# Patient Record
Sex: Female | Born: 1975 | Race: White | Hispanic: Yes | Marital: Married | Smoking: Never smoker
Health system: Southern US, Community
[De-identification: ages and names within clinical notes are randomized; demographics above are authoritative.]

## PROBLEM LIST (undated history)

## (undated) DIAGNOSIS — Z758 Other problems related to medical facilities and other health care: Secondary | ICD-10-CM

## (undated) DIAGNOSIS — O0930 Supervision of pregnancy with insufficient antenatal care, unspecified trimester: Secondary | ICD-10-CM

## (undated) DIAGNOSIS — E669 Obesity, unspecified: Secondary | ICD-10-CM

## (undated) DIAGNOSIS — Z789 Other specified health status: Secondary | ICD-10-CM

## (undated) DIAGNOSIS — Z603 Acculturation difficulty: Secondary | ICD-10-CM

## (undated) DIAGNOSIS — O24419 Gestational diabetes mellitus in pregnancy, unspecified control: Secondary | ICD-10-CM

## (undated) DIAGNOSIS — D179 Benign lipomatous neoplasm, unspecified: Secondary | ICD-10-CM

## (undated) HISTORY — PX: NO PAST SURGERIES: SHX2092

## (undated) HISTORY — DX: Gestational diabetes mellitus in pregnancy, unspecified control: O24.419

## (undated) HISTORY — DX: Obesity, unspecified: E66.9

## (undated) HISTORY — DX: Acculturation difficulty: Z60.3

## (undated) HISTORY — DX: Other specified health status: Z78.9

## (undated) HISTORY — DX: Supervision of pregnancy with insufficient antenatal care, unspecified trimester: O09.30

## (undated) HISTORY — DX: Other problems related to medical facilities and other health care: Z75.8

---

## 2005-03-19 ENCOUNTER — Ambulatory Visit: Payer: Self-pay | Admitting: Obstetrics and Gynecology

## 2005-03-23 ENCOUNTER — Inpatient Hospital Stay (HOSPITAL_COMMUNITY): Admission: AD | Admit: 2005-03-23 | Discharge: 2005-03-24 | Payer: Self-pay | Admitting: *Deleted

## 2005-03-24 ENCOUNTER — Ambulatory Visit: Payer: Self-pay | Admitting: *Deleted

## 2005-03-24 ENCOUNTER — Inpatient Hospital Stay (HOSPITAL_COMMUNITY): Admission: AD | Admit: 2005-03-24 | Discharge: 2005-03-26 | Payer: Self-pay | Admitting: *Deleted

## 2005-03-24 ENCOUNTER — Ambulatory Visit: Payer: Self-pay | Admitting: Family Medicine

## 2009-05-10 ENCOUNTER — Emergency Department (HOSPITAL_COMMUNITY): Admission: EM | Admit: 2009-05-10 | Discharge: 2009-05-10 | Payer: Self-pay | Admitting: Family Medicine

## 2009-05-21 ENCOUNTER — Emergency Department (HOSPITAL_COMMUNITY): Admission: EM | Admit: 2009-05-21 | Discharge: 2009-05-21 | Payer: Self-pay | Admitting: Family Medicine

## 2009-06-13 ENCOUNTER — Ambulatory Visit: Payer: Self-pay | Admitting: Internal Medicine

## 2009-07-04 ENCOUNTER — Ambulatory Visit: Payer: Self-pay | Admitting: Family Medicine

## 2009-07-04 LAB — CONVERTED CEMR LAB
ALT: 17 units/L (ref 0–35)
AST: 15 units/L (ref 0–37)
Albumin: 4.6 g/dL (ref 3.5–5.2)
Alkaline Phosphatase: 53 units/L (ref 39–117)
Basophils Absolute: 0 10*3/uL (ref 0.0–0.1)
Chloride: 105 meq/L (ref 96–112)
Hemoglobin: 12.3 g/dL (ref 12.0–15.0)
Lymphocytes Relative: 40 % (ref 12–46)
Lymphs Abs: 3.1 10*3/uL (ref 0.7–4.0)
Neutro Abs: 4 10*3/uL (ref 1.7–7.7)
Platelets: 267 10*3/uL (ref 150–400)
Potassium: 3.9 meq/L (ref 3.5–5.3)
RDW: 12.7 % (ref 11.5–15.5)
Sodium: 139 meq/L (ref 135–145)
Total Protein: 7.8 g/dL (ref 6.0–8.3)
Vit D, 25-Hydroxy: 12 ng/mL — ABNORMAL LOW (ref 30–89)
WBC: 7.9 10*3/uL (ref 4.0–10.5)

## 2009-07-11 ENCOUNTER — Ambulatory Visit: Payer: Self-pay | Admitting: Family Medicine

## 2009-07-23 ENCOUNTER — Encounter: Admission: RE | Admit: 2009-07-23 | Discharge: 2009-08-23 | Payer: Self-pay | Admitting: Family Medicine

## 2009-07-23 ENCOUNTER — Encounter (INDEPENDENT_AMBULATORY_CARE_PROVIDER_SITE_OTHER): Payer: Self-pay | Admitting: *Deleted

## 2010-01-21 ENCOUNTER — Encounter (INDEPENDENT_AMBULATORY_CARE_PROVIDER_SITE_OTHER): Payer: Self-pay | Admitting: Family Medicine

## 2010-01-21 LAB — CONVERTED CEMR LAB
LDL Cholesterol: 125 mg/dL — ABNORMAL HIGH (ref 0–99)
Total CHOL/HDL Ratio: 5.4
VLDL: 47 mg/dL — ABNORMAL HIGH (ref 0–40)

## 2010-05-21 NOTE — Letter (Signed)
Summary: *HSN Results Follow up  [facility]  [address]   [place], [place] [address]   Phone: [phone]  Fax: [phone]      07/23/2009   VIHANA KYDD DIAZ [address] [place], [place]  [address]   Dear  Ms. Gerrianne OJEDA DIAZ,                            ____S.Drinkard,FNP   __x__C. Inman,MD       ____B. McPherson,MD   ____V. Rankins,MD    ____E. Mulberry,MD    ____N. Daphine Deutscher, FNP  ____D. Reche Dixon, MD    ____K. Philipp Deputy, MD    ____Other     This letter is to inform you that your recent test(s):  ____x___Pap Smear    _______Lab Test     _______X-ray    ____x___ is within acceptable limits  _______ requires a medication change  _______ requires a follow-up lab visit  _______ requires a follow-up visit with your provider   Comments: repeat in one year       _________________________________________________________ If you have any questions, please contact our office                     Sincerely,  Gaylyn Cheers RN [address]

## 2010-07-08 LAB — POCT URINALYSIS DIP (DEVICE)
Bilirubin Urine: NEGATIVE
Glucose, UA: NEGATIVE mg/dL
Nitrite: NEGATIVE
Nitrite: NEGATIVE
Protein, ur: 30 mg/dL — AB
pH: 5 (ref 5.0–8.0)
pH: 5 (ref 5.0–8.0)

## 2010-07-08 LAB — CBC
HCT: 36.3 % (ref 36.0–46.0)
MCHC: 34.1 g/dL (ref 30.0–36.0)
MCV: 87.9 fL (ref 78.0–100.0)
Platelets: 161 10*3/uL (ref 150–400)
WBC: 7.9 10*3/uL (ref 4.0–10.5)

## 2010-07-08 LAB — POCT I-STAT, CHEM 8
Creatinine, Ser: 0.5 mg/dL (ref 0.4–1.2)
Glucose, Bld: 112 mg/dL — ABNORMAL HIGH (ref 70–99)
Hemoglobin: 12.9 g/dL (ref 12.0–15.0)
Sodium: 136 mEq/L (ref 135–145)
TCO2: 21 mmol/L (ref 0–100)

## 2010-07-08 LAB — DIFFERENTIAL
Eosinophils Absolute: 0 10*3/uL (ref 0.0–0.7)
Eosinophils Relative: 0 % (ref 0–5)
Lymphs Abs: 0.6 10*3/uL — ABNORMAL LOW (ref 0.7–4.0)
Monocytes Relative: 7 % (ref 3–12)

## 2010-07-08 LAB — POCT PREGNANCY, URINE: Preg Test, Ur: NEGATIVE

## 2010-07-08 LAB — URINE CULTURE

## 2011-01-30 ENCOUNTER — Other Ambulatory Visit: Payer: Self-pay | Admitting: Family Medicine

## 2011-01-30 DIAGNOSIS — Z3689 Encounter for other specified antenatal screening: Secondary | ICD-10-CM

## 2011-01-30 LAB — RUBELLA ANTIBODY, IGM: Rubella: IMMUNE

## 2011-02-05 ENCOUNTER — Ambulatory Visit (HOSPITAL_COMMUNITY)
Admission: RE | Admit: 2011-02-05 | Discharge: 2011-02-05 | Disposition: A | Discharge status: Home / Self Care | Payer: Medicaid Other | Source: Ambulatory Visit | Attending: Family Medicine | Admitting: Family Medicine

## 2011-02-05 DIAGNOSIS — Z363 Encounter for antenatal screening for malformations: Secondary | ICD-10-CM | POA: Insufficient documentation

## 2011-02-05 DIAGNOSIS — Z3689 Encounter for other specified antenatal screening: Secondary | ICD-10-CM

## 2011-02-05 DIAGNOSIS — Z1389 Encounter for screening for other disorder: Secondary | ICD-10-CM | POA: Insufficient documentation

## 2011-02-05 DIAGNOSIS — O358XX0 Maternal care for other (suspected) fetal abnormality and damage, not applicable or unspecified: Secondary | ICD-10-CM | POA: Insufficient documentation

## 2011-03-27 LAB — RPR: RPR: NONREACTIVE

## 2011-04-22 NOTE — L&D Delivery Note (Signed)
Delivery Note This is a G3 P2 at 41.4 weeks who was admitted for IOL for postdates.   She progressed well in labor and became completely dilated and immediately felt the urge to push. At 9:44 AM a viable and healthy female was delivered via Vaginal, Spontaneous Delivery (Presentation: ; Occiput Anterior).  APGAR: 9, 9; weight 7 lb 1.4 oz (3215 g).   No difficulty with shoulders. There was a tight nuchal cord, baby delivered through it.  Placenta status: Intact, Spontaneous.  Cord:  with the following complications: None.   There was a small 2nd degree midline laceration which was repaired in the usual fashion.   Anesthesia: None  Episiotomy: None Lacerations: 2nd degree;Perineal Suture Repair: 3.0 vicryl rapide Est. Blood Loss (mL): 200  Mom to postpartum.  Baby to nursery-stable.  [facility] 06/15/2011, 10:04 AM

## 2011-05-14 LAB — STREP B DNA PROBE: GBS: NEGATIVE

## 2011-06-04 ENCOUNTER — Other Ambulatory Visit (HOSPITAL_COMMUNITY): Payer: Self-pay | Admitting: Physician Assistant

## 2011-06-04 DIAGNOSIS — O480 Post-term pregnancy: Secondary | ICD-10-CM

## 2011-06-05 ENCOUNTER — Telehealth (HOSPITAL_COMMUNITY): Payer: Self-pay | Admitting: *Deleted

## 2011-06-05 ENCOUNTER — Encounter (HOSPITAL_COMMUNITY): Payer: Self-pay | Admitting: *Deleted

## 2011-06-05 ENCOUNTER — Inpatient Hospital Stay (HOSPITAL_COMMUNITY): Admission: AD | Admit: 2011-06-05 | Payer: Self-pay | Source: Ambulatory Visit | Admitting: Family Medicine

## 2011-06-05 NOTE — Telephone Encounter (Signed)
Preadmission screen  

## 2011-06-06 ENCOUNTER — Ambulatory Visit (HOSPITAL_COMMUNITY)
Admission: RE | Admit: 2011-06-06 | Discharge: 2011-06-06 | Disposition: A | Discharge status: Home / Self Care | Payer: Medicaid Other | Source: Ambulatory Visit | Attending: Physician Assistant | Admitting: Physician Assistant

## 2011-06-06 ENCOUNTER — Telehealth (HOSPITAL_COMMUNITY): Payer: Self-pay | Admitting: *Deleted

## 2011-06-06 DIAGNOSIS — O480 Post-term pregnancy: Secondary | ICD-10-CM

## 2011-06-06 DIAGNOSIS — Z3689 Encounter for other specified antenatal screening: Secondary | ICD-10-CM | POA: Insufficient documentation

## 2011-06-09 ENCOUNTER — Encounter (HOSPITAL_COMMUNITY): Payer: Self-pay | Admitting: *Deleted

## 2011-06-13 ENCOUNTER — Ambulatory Visit (INDEPENDENT_AMBULATORY_CARE_PROVIDER_SITE_OTHER): Payer: Self-pay | Admitting: *Deleted

## 2011-06-13 VITALS — BP 117/66 | Wt 185.5 lb

## 2011-06-13 DIAGNOSIS — O480 Post-term pregnancy: Secondary | ICD-10-CM

## 2011-06-13 NOTE — Progress Notes (Signed)
P = 70    Pt has IOL appt on 2/23 @ 1930

## 2011-06-13 NOTE — Progress Notes (Signed)
NST 06/13/11 reviewed and reactive

## 2011-06-14 ENCOUNTER — Inpatient Hospital Stay (HOSPITAL_COMMUNITY)
Admission: RE | Admit: 2011-06-14 | Discharge: 2011-06-17 | DRG: 775 | Disposition: A | Discharge status: Home / Self Care | Payer: Medicaid Other | Source: Ambulatory Visit | Attending: Obstetrics and Gynecology | Admitting: Obstetrics and Gynecology

## 2011-06-14 ENCOUNTER — Encounter (HOSPITAL_COMMUNITY): Payer: Self-pay

## 2011-06-14 DIAGNOSIS — O99893 Other specified diseases and conditions complicating puerperium: Secondary | ICD-10-CM | POA: Diagnosis not present

## 2011-06-14 DIAGNOSIS — H1010 Acute atopic conjunctivitis, unspecified eye: Secondary | ICD-10-CM | POA: Diagnosis not present

## 2011-06-14 DIAGNOSIS — O480 Post-term pregnancy: Principal | ICD-10-CM | POA: Diagnosis present

## 2011-06-14 HISTORY — DX: Other specified health status: Z78.9

## 2011-06-14 LAB — CBC
HCT: 36.5 % (ref 36.0–46.0)
MCV: 88.6 fL (ref 78.0–100.0)
RDW: 13.4 % (ref 11.5–15.5)
WBC: 9.1 10*3/uL (ref 4.0–10.5)

## 2011-06-14 MED ORDER — FLEET ENEMA 7-19 GM/118ML RE ENEM: 1.0000 | ENEMA | RECTAL | Status: DC | PRN

## 2011-06-14 MED ORDER — OXYTOCIN 20 UNITS IN LACTATED RINGERS INFUSION - SIMPLE
125.0000 mL/h | Freq: Once | INTRAVENOUS | Status: AC
  Administered 2011-06-15: 125 mL/h via INTRAVENOUS

## 2011-06-14 MED ORDER — CITRIC ACID-SODIUM CITRATE 334-500 MG/5ML PO SOLN: 30.0000 mL | ORAL | Status: DC | PRN

## 2011-06-14 MED ORDER — ZOLPIDEM TARTRATE 10 MG PO TABS: 10.0000 mg | ORAL_TABLET | Freq: Every evening | ORAL | Status: DC | PRN

## 2011-06-14 MED ORDER — ONDANSETRON HCL 4 MG/2ML IJ SOLN: 4.0000 mg | Freq: Four times a day (QID) | INTRAMUSCULAR | Status: DC | PRN

## 2011-06-14 MED ORDER — LIDOCAINE HCL (PF) 1 % IJ SOLN
30.0000 mL | INTRAMUSCULAR | Status: DC | PRN
  Administered 2011-06-15: 30 mL via SUBCUTANEOUS
  Filled 2011-06-14: qty 30

## 2011-06-14 MED ORDER — OXYCODONE-ACETAMINOPHEN 5-325 MG PO TABS
1.0000 | ORAL_TABLET | ORAL | Status: DC | PRN
  Administered 2011-06-15: 1 via ORAL
  Filled 2011-06-14: qty 1

## 2011-06-14 MED ORDER — BUTORPHANOL TARTRATE 2 MG/ML IJ SOLN
1.0000 mg | INTRAMUSCULAR | Status: DC | PRN
  Administered 2011-06-15: 1 mg via INTRAVENOUS
  Filled 2011-06-14: qty 1

## 2011-06-14 MED ORDER — ACETAMINOPHEN 325 MG PO TABS: 650.0000 mg | ORAL_TABLET | ORAL | Status: DC | PRN

## 2011-06-14 MED ORDER — OXYTOCIN BOLUS FROM INFUSION
500.0000 mL | Freq: Once | INTRAVENOUS | Status: DC
  Filled 2011-06-14: qty 500

## 2011-06-14 MED ORDER — LACTATED RINGERS IV SOLN
INTRAVENOUS | Status: DC
  Administered 2011-06-14: 20:00:00 via INTRAVENOUS

## 2011-06-14 MED ORDER — LACTATED RINGERS IV SOLN
500.0000 mL | INTRAVENOUS | Status: DC | PRN
  Administered 2011-06-15: 500 mL via INTRAVENOUS

## 2011-06-14 MED ORDER — IBUPROFEN 600 MG PO TABS: 600.0000 mg | ORAL_TABLET | Freq: Four times a day (QID) | ORAL | Status: DC | PRN

## 2011-06-14 MED ORDER — OXYTOCIN 20 UNITS IN LACTATED RINGERS INFUSION - SIMPLE
1.0000 m[IU]/min | INTRAVENOUS | Status: DC
  Administered 2011-06-14: 2 m[IU]/min via INTRAVENOUS
  Filled 2011-06-14: qty 1000

## 2011-06-14 NOTE — H&P (Signed)
Regina Castaneda is a 36 y.o. female presenting for induction of labor for postdates.History OB History    Grav Para Term Preterm Abortions TAB SAB Ect Mult Living   3 2 2  0      2     Past Medical History  Diagnosis Date  . Obese   . Late prenatal care   . Language Barrier     spanish  . No pertinent past medical history    Past Surgical History  Procedure Date  . No past surgeries    Family History: family history includes Asthma in her son and Peripheral vascular disease in her mother. Social History:  reports that she has never smoked. She has never used smokeless tobacco. She reports that she does not drink alcohol or use illicit drugs.  Review of Systems  Gastrointestinal: Positive for abdominal pain (intermittent contractions).  All other systems reviewed and are negative.    Dilation: 3 Effacement (%): 50 Station: -1 Exam by:: Roney Marion, CNM Blood pressure 153/88, pulse 69, temperature 97.9 F (36.6 C), temperature source Oral, resp. rate 20, height 5\' 3"  (1.6 m), weight 185 lb (83.915 kg), last menstrual period 08/24/2010. Maternal Exam:  Introitus: Vagina is positive for vaginal discharge (mucusy).    Physical Exam  Constitutional: She is oriented to person, place, and time. She appears well-developed and well-nourished. No distress.  HENT:  Head: Normocephalic.  Neck: Normal range of motion. Neck supple.  Cardiovascular: Normal rate, regular rhythm and normal heart sounds.   Respiratory: Effort normal and breath sounds normal. No respiratory distress.  GI: Soft. There is no tenderness.       EFW 7.5lbs  Genitourinary: No bleeding around the vagina. Vaginal discharge (mucusy) found.  Musculoskeletal: Normal range of motion.  Neurological: She is alert and oriented to person, place, and time.  Skin: Skin is warm and dry.   FHR 140's, +accel, reactive Prenatal labs: ABO, Rh: A/Positive/-- (10/11 0000) Antibody: Negative (10/11 0000) Rubella: Immune  (10/11 0000) RPR: Nonreactive (12/06 0000)  HBsAg: Negative (10/11 0000)  HIV: Non-reactive (10/11 0000)  GBS: Negative (01/23 0000)   Assessment/Plan: Induction of Labor Post Dates  Plan: Admit to [facility]thing Suites Pitocin Reassess prn   [facility] 06/14/2011, 8:34 PM

## 2011-06-15 ENCOUNTER — Encounter (HOSPITAL_COMMUNITY): Payer: Self-pay

## 2011-06-15 DIAGNOSIS — O480 Post-term pregnancy: Secondary | ICD-10-CM

## 2011-06-15 DIAGNOSIS — O99893 Other specified diseases and conditions complicating puerperium: Secondary | ICD-10-CM

## 2011-06-15 DIAGNOSIS — O9989 Other specified diseases and conditions complicating pregnancy, childbirth and the puerperium: Secondary | ICD-10-CM

## 2011-06-15 MED ORDER — BENZOCAINE-MENTHOL 20-0.5 % EX AERO: 1.0000 "application " | INHALATION_SPRAY | CUTANEOUS | Status: DC | PRN

## 2011-06-15 MED ORDER — LANOLIN HYDROUS EX OINT: TOPICAL_OINTMENT | CUTANEOUS | Status: DC | PRN

## 2011-06-15 MED ORDER — ONDANSETRON HCL 4 MG/2ML IJ SOLN: 4.0000 mg | INTRAMUSCULAR | Status: DC | PRN

## 2011-06-15 MED ORDER — WITCH HAZEL-GLYCERIN EX PADS: 1.0000 "application " | MEDICATED_PAD | CUTANEOUS | Status: DC | PRN

## 2011-06-15 MED ORDER — PRENATAL MULTIVITAMIN CH
1.0000 | ORAL_TABLET | Freq: Every day | ORAL | Status: DC
  Administered 2011-06-15: 1 via ORAL
  Filled 2011-06-15 (×2): qty 1

## 2011-06-15 MED ORDER — PRENATAL MULTIVITAMIN CH
1.0000 | ORAL_TABLET | Freq: Every day | ORAL | Status: DC
  Administered 2011-06-16 – 2011-06-17 (×2): 1 via ORAL

## 2011-06-15 MED ORDER — BENZOCAINE-MENTHOL 20-0.5 % EX AERO
INHALATION_SPRAY | CUTANEOUS | Status: AC
  Administered 2011-06-15: 13:00:00
  Filled 2011-06-15: qty 56

## 2011-06-15 MED ORDER — SENNOSIDES-DOCUSATE SODIUM 8.6-50 MG PO TABS
2.0000 | ORAL_TABLET | Freq: Every day | ORAL | Status: DC
  Administered 2011-06-15 – 2011-06-16 (×2): 2 via ORAL

## 2011-06-15 MED ORDER — ONDANSETRON HCL 4 MG PO TABS: 4.0000 mg | ORAL_TABLET | ORAL | Status: DC | PRN

## 2011-06-15 MED ORDER — DIPHENHYDRAMINE HCL 25 MG PO CAPS: 25.0000 mg | ORAL_CAPSULE | Freq: Four times a day (QID) | ORAL | Status: DC | PRN

## 2011-06-15 MED ORDER — IBUPROFEN 600 MG PO TABS
600.0000 mg | ORAL_TABLET | Freq: Four times a day (QID) | ORAL | Status: DC
  Administered 2011-06-15 – 2011-06-17 (×7): 600 mg via ORAL
  Filled 2011-06-15 (×7): qty 1

## 2011-06-15 MED ORDER — OXYCODONE-ACETAMINOPHEN 5-325 MG PO TABS
1.0000 | ORAL_TABLET | ORAL | Status: DC | PRN
  Administered 2011-06-15: 1 via ORAL
  Filled 2011-06-15: qty 1

## 2011-06-15 MED ORDER — TETANUS-DIPHTH-ACELL PERTUSSIS 5-2.5-18.5 LF-MCG/0.5 IM SUSP
0.5000 mL | Freq: Once | INTRAMUSCULAR | Status: AC
  Administered 2011-06-16: 0.5 mL via INTRAMUSCULAR
  Filled 2011-06-15: qty 0.5

## 2011-06-15 MED ORDER — DIBUCAINE 1 % RE OINT: 1.0000 "application " | TOPICAL_OINTMENT | RECTAL | Status: DC | PRN

## 2011-06-15 MED ORDER — SIMETHICONE 80 MG PO CHEW: 80.0000 mg | CHEWABLE_TABLET | ORAL | Status: DC | PRN

## 2011-06-15 MED ORDER — ZOLPIDEM TARTRATE 5 MG PO TABS: 5.0000 mg | ORAL_TABLET | Freq: Every evening | ORAL | Status: DC | PRN

## 2011-06-15 NOTE — Progress Notes (Signed)
Marta - interpreter @ bedside 

## 2011-06-15 NOTE — Progress Notes (Signed)
  Subjective: Pt with slight increase in pain with contractions.    Objective: BP 128/72  Pulse 73  Temp(Src) 97.9 F (36.6 C) (Oral)  Resp 20  Ht 5\' 3"  (1.6 m)  Wt 83.915 kg (185 lb)  BMI 32.77 kg/m2  LMP 08/24/2010      FHT:  FHR: 130's bpm, variability: moderate,  accelerations:  Present,  decelerations:  Present variables, intermittent UC:   irregular, every 3-6 minutes SVE:   Dilation: 3 Effacement (%): 50 Station: -1 Exam by:: Roney Marion, CNM  Labs: Lab Results  Component Value Date   WBC 9.1 06/14/2011   HGB 12.2 06/14/2011   HCT 36.5 06/14/2011   MCV 88.6 06/14/2011   PLT 191 06/14/2011    Assessment / Plan: Augmentation of labor, progressing well  Labor: Progressing on Pitocin, will continue to increase then AROM Preeclampsia:  n/a Fetal Wellbeing:  Category II Pain Control:  Labor support without medications I/D:  n/a Anticipated MOD:  NSVD  [facility] 06/15/2011, 1:09 AM

## 2011-06-15 NOTE — Progress Notes (Signed)
walidah cnm called dr constant to do another bedside ultrasound to confirm if pt is vertex

## 2011-06-15 NOTE — Progress Notes (Signed)
  Subjective: Pt reports leaking of fluid at 0530 and increase in contraction pain.  Objective: BP 121/69  Pulse 64  Temp(Src) 98.4 F (36.9 C) (Oral)  Resp 20  Ht 5\' 3"  (1.6 m)  Wt 83.915 kg (185 lb)  BMI 32.77 kg/m2  LMP 08/24/2010      FHT:  FHR: 130's bpm, variability: moderate,  accelerations:  Present,  decelerations:  Absent UC:   irregular, every 2-5 minutes SVE:   Dilation: 4.5 Effacement (%): 50 Station: -1 Exam by:: dr constant, per bedside ultrasound  Labs: Lab Results  Component Value Date   WBC 9.1 06/14/2011   HGB 12.2 06/14/2011   HCT 36.5 06/14/2011   MCV 88.6 06/14/2011   PLT 191 06/14/2011    Assessment / Plan: Induction of Labor for Postdates Spontaneous Rupture of Membranes  Labor: Slow progression Preeclampsia:  n/a Fetal Wellbeing:  Category I Pain Control:  Labor support without medications I/D:  n/a Anticipated MOD:  NSVD  [facility] 06/15/2011, 6:34 AM

## 2011-06-15 NOTE — Progress Notes (Signed)
Marta interpreter at bedside

## 2011-06-16 MED ORDER — ERYTHROMYCIN 5 MG/GM OP OINT
TOPICAL_OINTMENT | Freq: Four times a day (QID) | OPHTHALMIC | Status: DC
  Administered 2011-06-16 (×2): 1 via OPHTHALMIC
  Administered 2011-06-16 – 2011-06-17 (×3): via OPHTHALMIC
  Filled 2011-06-16: qty 3.5

## 2011-06-16 NOTE — Progress Notes (Signed)
Post Partum Day #1 Subjective: up ad lib, voiding, tolerating PO and + flatus Complaining of acute onset dry, red, itchy eyes bilaterally with discharge. Patient up with no difficulty. Breast and bottle feeding  Objective: Blood pressure 98/59, pulse 74, temperature 98.5 F (36.9 C), temperature source Oral, resp. rate 18, height 5\' 3"  (1.6 m), weight 83.915 kg (185 lb), last menstrual period 08/24/2010, unknown if currently breastfeeding.  Physical Exam:  General: alert, cooperative and no distress HEENT: Erythema and swelling of eyes bilaterally. Injected conjunctiva. Minimal discharge noted.  Lochia: appropriate Uterine Fundus: firm DVT Evaluation: No evidence of DVT seen on physical exam.   Basename 06/14/11 2005  HGB 12.2  HCT 36.5    Assessment/Plan: Plan for discharge tomorrow, Breastfeeding, Lactation consult and Contraception Condoms Conjunctivitis bilaterally: Allergic vs. Viral given bilateral appearance and acute onset. Will give Erythromycin ointment q6 hr.    LOS: 2 days   Regina Castaneda 06/16/2011, 7:37 AM

## 2011-06-16 NOTE — Progress Notes (Signed)
UR chart review completed.  

## 2011-06-16 NOTE — H&P (Signed)
Agree with above note.  Regina Castaneda 06/16/2011 11:56 AM

## 2011-06-16 NOTE — Plan of Care (Signed)
Problem: Discharge Progression Outcomes Goal: Barriers To Progression Addressed/Resolved Outcome: Progressing Limited [place]

## 2011-06-17 MED ORDER — OXYCODONE-ACETAMINOPHEN 5-325 MG PO TABS: 1.0000 | ORAL_TABLET | ORAL | Status: AC | PRN

## 2011-06-17 MED ORDER — ERYTHROMYCIN 5 MG/GM OP OINT: TOPICAL_OINTMENT | Freq: Four times a day (QID) | OPHTHALMIC | Status: AC

## 2011-06-17 MED ORDER — IBUPROFEN 600 MG PO TABS: 600.0000 mg | ORAL_TABLET | Freq: Four times a day (QID) | ORAL | Status: AC

## 2011-06-17 NOTE — Progress Notes (Signed)
Post Partum Day #2 Subjective: no complaints, up ad lib, voiding, tolerating PO and + flatus Patient has conjunctivitis, which has improved with drops. She is able to self-administer the drops with no difficulty. Hand washing has been discussed. No other concerns. Patient was seen with interpreter.  Objective: Blood pressure 122/76, pulse 75, temperature 98 F (36.7 C), temperature source Oral, resp. rate 18, height 5\' 3"  (1.6 m), weight 83.915 kg (185 lb), last menstrual period 08/24/2010, unknown if currently breastfeeding.  Physical Exam:  General: alert, cooperative and no distress Lochia: appropriate Uterine Fundus: firm DVT Evaluation: No evidence of DVT seen on physical exam.   Basename 06/14/11 2005  HGB 12.2  HCT 36.5    Assessment/Plan: Discharge home, Breastfeeding and Contraception Condoms Patient [facility] in 6 weeks.   LOS: 3 days   Latrese Carolan 06/17/2011, 7:11 AM

## 2011-06-17 NOTE — Discharge Summary (Signed)
Obstetric Discharge Summary Reason for Admission: induction of labor Prenatal Procedures: none Intrapartum Procedures: spontaneous vaginal delivery Postpartum Procedures: none Complications-Operative and Postpartum: none and patient with bilateral conjunctivitis (allergic vs viral.) Improved with Erythromycin drops. Hemoglobin  Date Value Range Status  06/14/2011 12.2  12.0-15.0 (g/dL) Final     HCT  Date Value Range Status  06/14/2011 36.5  36.0-46.0 (%) Final    Discharge Diagnoses: Term Pregnancy-delivered  Discharge Information: Date: 06/17/2011 Activity: pelvic rest Diet: routine Medications: Ibuprofen, Percocet and Erythromycin drops Condition: stable Instructions: refer to practice specific booklet Discharge to: home Follow-up Information    Follow up with [facility] [place] in 6 weeks.   Contact information:   [address] [employer] [place] [address]          Newborn Data: Live born female  Birth Weight: 7 lb 1.4 oz (3215 g) APGAR: 9, 9  Home with mother. Breast and bottle feeding. Plans to use condoms for contraception. Will follow up [facility] the [facility] in 6 weeks.  Regina Castaneda 06/17/2011, 7:48 AM

## 2011-06-17 NOTE — Progress Notes (Signed)
[facility] Department of Clinical Social Work Documentation of Interpretation   I assisted __Faculty Practice_________________ with interpretation of ___discharge___________________ for this patient.

## 2011-07-13 ENCOUNTER — Inpatient Hospital Stay (HOSPITAL_COMMUNITY)
Admission: AD | Admit: 2011-07-13 | Discharge: 2011-07-13 | Disposition: A | Discharge status: Home / Self Care | Payer: Medicaid Other | Source: Ambulatory Visit | Attending: Obstetrics & Gynecology | Admitting: Obstetrics & Gynecology

## 2011-07-13 DIAGNOSIS — N644 Mastodynia: Secondary | ICD-10-CM | POA: Insufficient documentation

## 2011-07-13 DIAGNOSIS — R509 Fever, unspecified: Secondary | ICD-10-CM | POA: Insufficient documentation

## 2011-07-13 DIAGNOSIS — N61 Mastitis without abscess: Secondary | ICD-10-CM

## 2011-07-13 DIAGNOSIS — O9122 Nonpurulent mastitis associated with the puerperium: Secondary | ICD-10-CM | POA: Insufficient documentation

## 2011-07-13 LAB — URINALYSIS, ROUTINE W REFLEX MICROSCOPIC
Bilirubin Urine: NEGATIVE
Ketones, ur: NEGATIVE mg/dL
Nitrite: NEGATIVE
Protein, ur: NEGATIVE mg/dL
Specific Gravity, Urine: 1.01 (ref 1.005–1.030)
Urobilinogen, UA: 1 mg/dL (ref 0.0–1.0)

## 2011-07-13 LAB — URINE MICROSCOPIC-ADD ON

## 2011-07-13 MED ORDER — CEPHALEXIN 500 MG PO CAPS: 500.0000 mg | ORAL_CAPSULE | Freq: Three times a day (TID) | ORAL | Status: AC

## 2011-07-13 NOTE — MAU Note (Signed)
Onset of fever, sore throat, cough chest discomfort and leg pain, had vaginal delivery one month ago currently breastfeeding.

## 2011-07-13 NOTE — Discharge Instructions (Signed)
Mastitis  (Mastitis) La mastitis es una infeccin por grmenes (bacteriana ) en el tejido mamario. CAUSES Las bacterias que causan la infeccin pueden entrar a travs de cortes o aberturas de la piel al tejido [place]. Generalmente esto ocurre al [employer], debido a las grietas o irritacin de la piel. Tambin se relaciona con la obstruccin de los conductos. Un piercing en los pezones puede ocasionar una mastitis. SNTOMAS En la mastitis, una zona de la mama se hincha, enrojece y duele. Puede asociarse con fiebre e hinchazn de los ganglios de la axila del mismo lado. Si se permite que la infeccin progrese, podr formarse un absceso (acumulacin de pus) DIAGNSTICO El profesional diagnosticar mastitis basndose en los sntomas y el examen fsico. El diagnstico podr confirmarse si se observa la presencia de pus en la mama. El pus se examinar en el laboratorio para determinar de qu bacteria se trata. Si hay un absceso, debern retirarle el lquido con [place]. Con el lquido se confirmar el diagnstico y se Production assistant, radio bacteria que causa el problema. En la [employer] no se observa pus. Le solicitarn pruebas de sangre para determinar si su organismo est luchando contra una infeccin bacteriana. En algunos [employer] indicarn una mamografa o una prueba de ultrasonido para descartar otras enfermedades, inclusive Management consultant. Otras formas raras de mastitis:  La mastitis tuberculosa es rara pero ha habido un incremento en los [place]. El germen de la tuberculosis puede afectar la mama si est presente en otras partes del organismo. La mama puede estar ligeramente sensible pero no doler.   La sfilis del pezn deja una lesin que no duele.   La actinomicosis es una infeccin bacteriana rara de la mama que se presenta como una masa que no duele.   La flebitis (inflamacin de los vasos sanguneos) de la mama es una inflamacin de las venas. Las [employer] ser un sostn  demasiado [place], un traumatismo o [place].   El carcinoma inflamatorio puede semejar una mastitis debido a que la mama est roja, hinchada y sensible, pero es una forma rara de cncer de mama.  TRATAMIENTO Para destruir las bacterias se usan antibiticos. El Nurse, children's qu bacteria es ms probable que cause la infeccin y Art gallery manager el tipo de antibitico ms adecuado. Podr cambiarlo segn el resultado del cultivo o si la respuesta al antibitico no es la [place]. Los antibiticos se administran por va oral. Si est amamantando es importante que vace la mama. El Ecologist informar si su leche es segura para el beb o debe descartarla. Podr Engineer, materials con medicamentos. INSTRUCCIONES PARA EL CUIDADO DOMICILIARIO  Tome todos los medicamentos tal como se le indic. No los suspenda slo porque, luego de [address], se siente mejor.   Utilice los medicamentos de venta libre o de prescripcin para Chief Technology Officer, Environmental health practitioner o la [place], segn se lo indique el profesional que lo asiste.   Si est amamantando, mantenga los pezones limpios y secos. Podrn aconsejarle que deje de [employer] profesional considere que es seguro para el beb. Use un sacaleche segn las indicaciones si se ve obligada a dejar de amamantar.   No use un sostn demasiado ajustado. Use un buen sostn de soporte   Vace la primera mama completamente antes de amamantar con la segunda. Si el beb no vaca la mama por algn motivo, utilice un sacaleche.   Si debe regresar a su empleo, use un sacaleche en el horario de trabajo para  mantener los horarios.   Aumente la ingestin de lquidos, especialmente si tiene fiebre.   Evite que las mamas se llenen mucho de [place] ATENCIN MDICA SI:  Presenta secreciones purulentas (similares al pus) que provienen de la mama.   Los sntomas (problemas) empeoran.   El tratamiento no hace efecto luego de [address].  SOLICITE ATENCIN MDICA  DE INMEDIATO SI:  Tiene fiebre.   El dolor y la hinchazn empeoran.   El dolor aumenta y no se alivia con los medicamentos.   Observa una lnea que se extiende desde la mama hasta la axila.  Document Released: 01/15/2005 Document Revised: 03/27/2011 [facility] Patient Information 2012 [employer], [place].

## 2011-07-13 NOTE — MAU Provider Note (Signed)
Attestation of Attending Supervision of Advanced Practitioner: Evaluation and management procedures were performed by the PA/NP/CNM/OB Fellow under my supervision/collaboration. Chart reviewed, and agree with management and plan.  Jaynie Collins, M.D. 07/13/2011 9:23 PM

## 2011-07-13 NOTE — MAU Provider Note (Signed)
History     CSN: [id number]  Arrival date and time: 07/13/11 1755  36 y.o.G3P3003 presents with breast pain and fever.    Chief Complaint  Patient presents with  . Fever  . Chest Pain  . Leg Pain  . Cough  . Sore Throat   HPI Pt presents with breast pain in her right breast, accompanied by fever/chills, some nausea but no vomiting, h/a, and some cough and sore throat x2 days.  She denies vaginal itching/burning, vaginal bleeding, or urinary symptoms.  Spanish translator used for all history taking, assessment, and teaching.  OB History    Grav Para Term Preterm Abortions TAB SAB Ect Mult Living   3 3 3  0      3      Past Medical History  Diagnosis Date  . Obese   . Late prenatal care   . Language Barrier     spanish  . No pertinent past medical history     Past Surgical History  Procedure Date  . No past surgeries     Family History  Problem Relation Age of Onset  . Peripheral vascular disease Mother   . Asthma Son     History  Substance Use Topics  . Smoking status: Never Smoker   . Smokeless tobacco: Never Used  . Alcohol Use: No    Allergies: No Known Allergies  Prescriptions prior to admission  Medication Sig Dispense Refill  . ibuprofen (ADVIL,MOTRIN) 600 MG tablet Take 600 mg by mouth every 6 (six) hours as needed. pain      . Prenatal Vit-Fe Fumarate-FA (PRENATAL MULTIVITAMIN) TABS Take 1 tablet by mouth daily.        Review of Systems  Constitutional: Positive for fever, chills and malaise/fatigue.  Eyes: Negative for blurred vision.  Respiratory: Positive for cough. Negative for shortness of breath.   Cardiovascular: Negative for chest pain.  Gastrointestinal: Negative for heartburn, nausea and vomiting.  Genitourinary: Negative for dysuria, urgency and frequency.  Musculoskeletal: Negative.   Neurological: Negative for dizziness and headaches.  Psychiatric/Behavioral: Negative for depression.   Physical Exam   Blood pressure 118/69,  pulse 90, temperature 99.2 F (37.3 C), resp. rate 20, height 5\' 1"  (1.549 m), weight 75.751 kg (167 lb), currently breastfeeding.  Physical Exam  Nursing note and vitals reviewed. Constitutional: She is oriented to person, place, and time. She appears well-developed and well-nourished.  Neck: Normal range of motion.  Cardiovascular: Normal rate, regular rhythm, normal heart sounds and intact distal pulses.   Respiratory: Effort normal and breath sounds normal. Right breast exhibits tenderness.    GI: Soft.  Musculoskeletal: Normal range of motion.  Neurological: She is alert and oriented to person, place, and time.  Skin: Skin is warm and dry.  Psychiatric: She has a normal mood and affect. Her behavior is normal. Judgment and thought content normal.   Results for orders placed during the hospital encounter of 07/13/11 (from the past 24 hour(s))  URINALYSIS, ROUTINE W REFLEX MICROSCOPIC     Status: Abnormal   Collection Time   07/13/11  6:15 PM      Component Value Range   Color, Urine YELLOW  YELLOW    APPearance CLEAR  CLEAR    Specific Gravity, Urine 1.010  1.005 - 1.030    pH 6.0  5.0 - 8.0    Glucose, UA NEGATIVE  NEGATIVE (mg/dL)   Hgb urine dipstick LARGE (*) NEGATIVE    Bilirubin Urine NEGATIVE  NEGATIVE  Ketones, ur NEGATIVE  NEGATIVE (mg/dL)   Protein, ur NEGATIVE  NEGATIVE (mg/dL)   Urobilinogen, UA 1.0  0.0 - 1.0 (mg/dL)   Nitrite NEGATIVE  NEGATIVE    Leukocytes, UA MODERATE (*) NEGATIVE   URINE MICROSCOPIC-ADD ON     Status: Abnormal   Collection Time   07/13/11  6:15 PM      Component Value Range   Squamous Epithelial / LPF FEW (*) RARE    WBC, UA 11-20  <3 (WBC/hpf)   RBC / HPF 21-50  <3 (RBC/hpf)   Bacteria, UA FEW (*) RARE   POCT PREGNANCY, URINE     Status: Normal   Collection Time   07/13/11  6:28 PM      Component Value Range   Preg Test, Ur NEGATIVE  NEGATIVE    MAU Course  Procedures U/A, breast exam   Assessment and Plan  A: Acute  mastitis of right breast  P: D/C home Keflex 500 mg TID x10 days Continue to breastfeed/pump from both breasts Warm moist heat for comfort, compresses/shower F/u with health department in 1 week Return to MAU as needed  LEFTWICH-KIRBY, Marshea Wisher 07/13/2011, 7:04 PM

## 2011-08-13 NOTE — Telephone Encounter (Signed)
Preadmission screen  

## 2012-11-20 ENCOUNTER — Encounter (HOSPITAL_COMMUNITY): Payer: Self-pay

## 2013-07-02 IMAGING — US US FETAL BPP W/O NONSTRESS
1 series · 13 of 13 positions shown · non-contrast
Comparison: none

[Series 1: us fetal bpp w/o nonstress · non-contrast · 13 acquisitions, 13 frames shown]
[im 1/13]
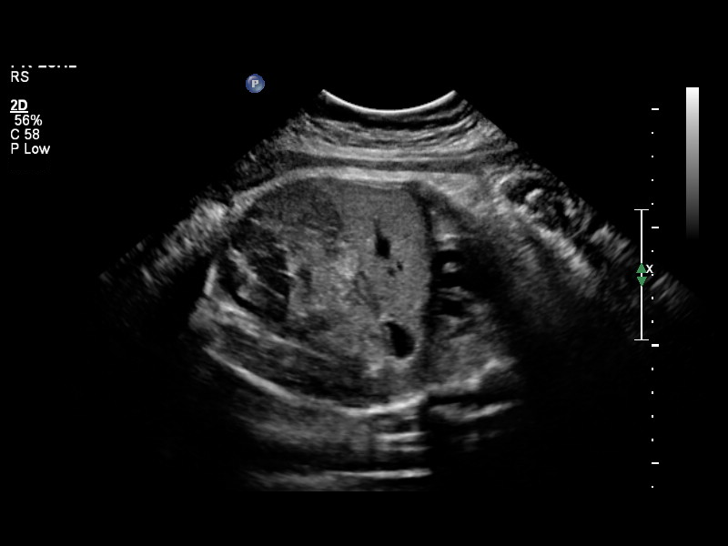
[im 2/13]
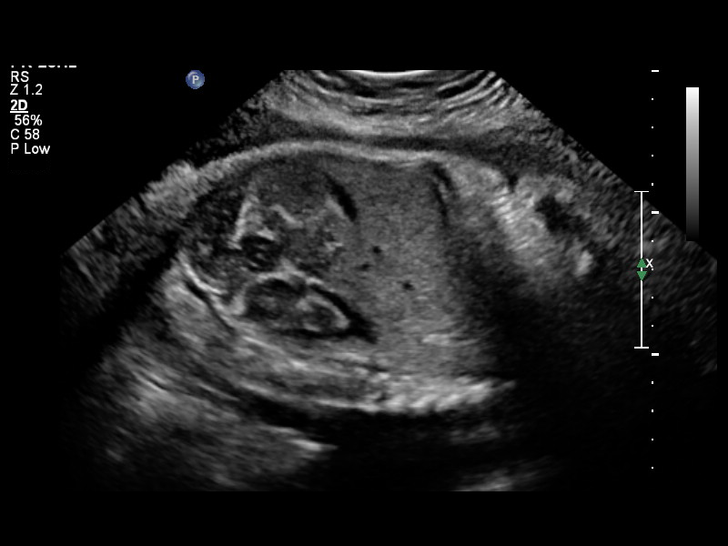
[im 3/13]
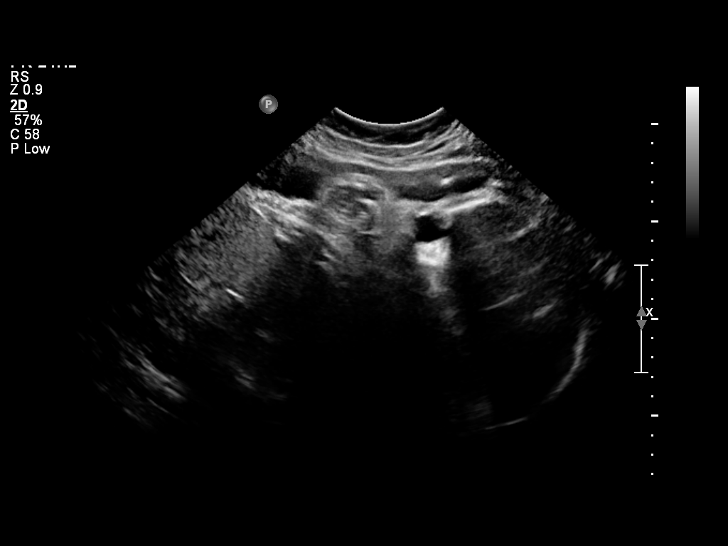
[im 4/13]
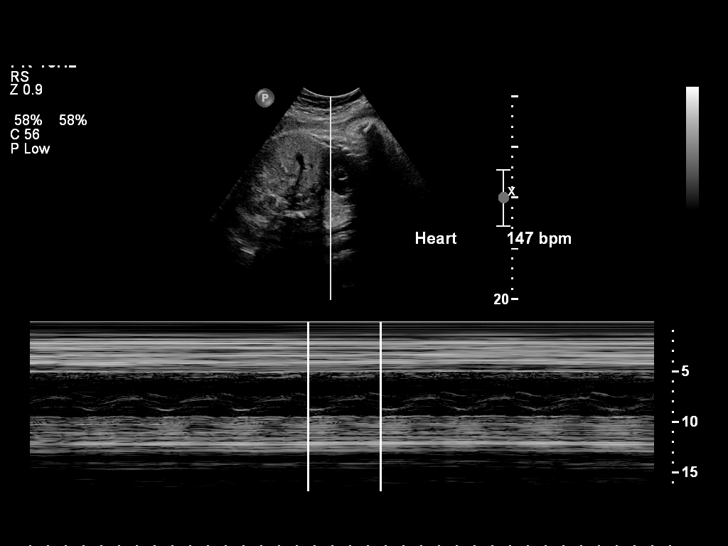
[im 5/13]
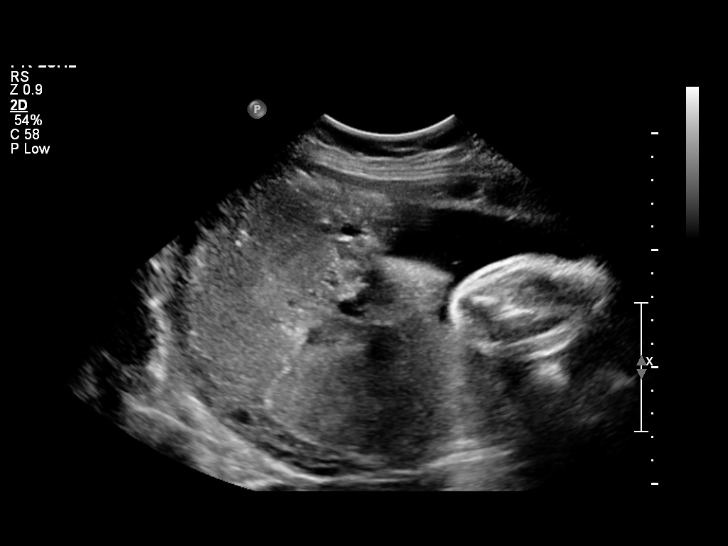
[im 6/13]
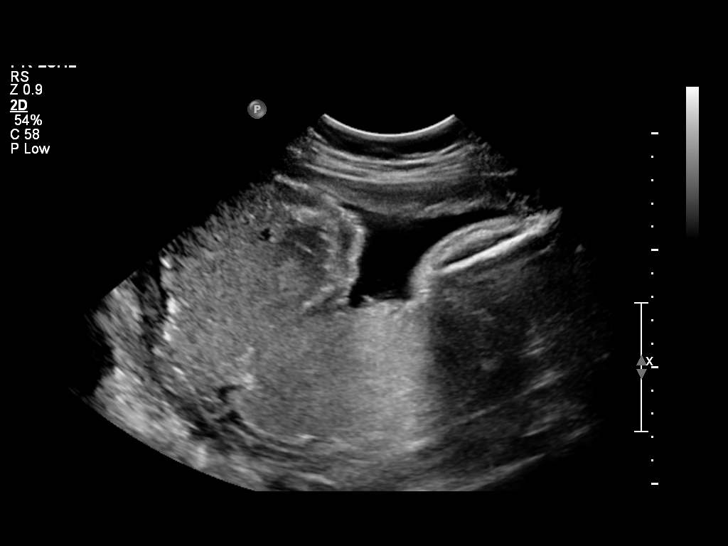
[im 7/13]
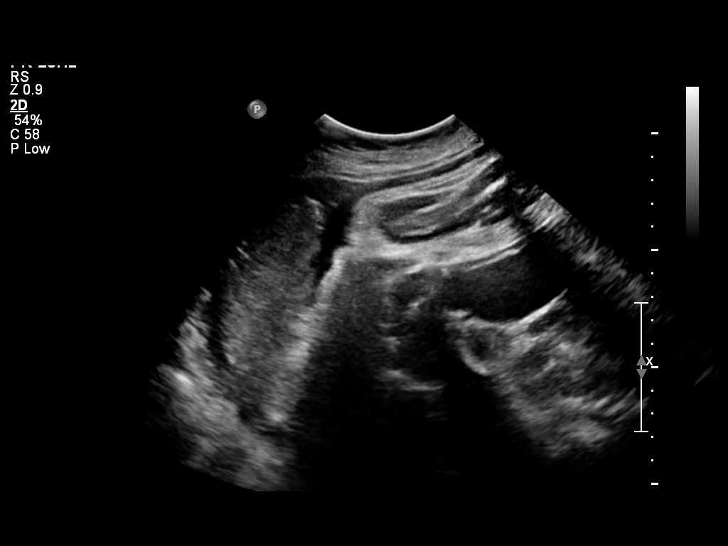
[im 8/13]
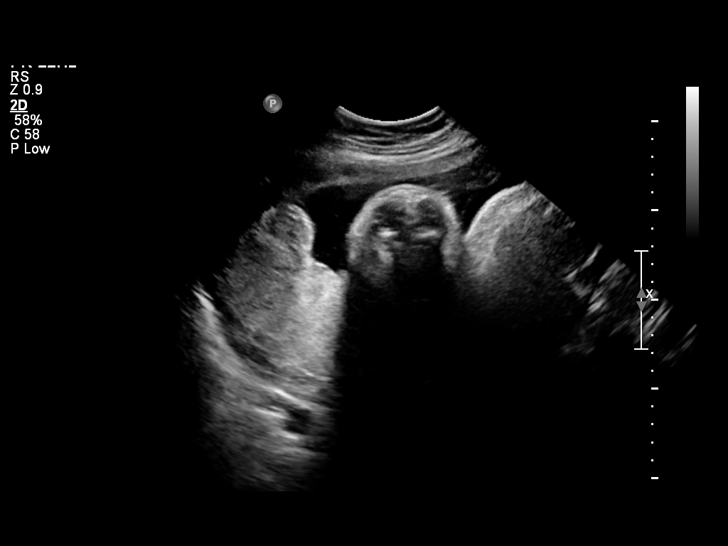
[im 9/13]
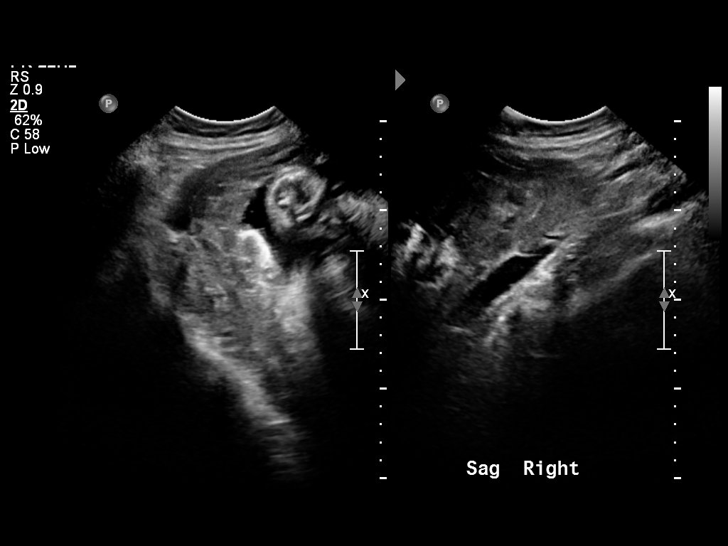
[im 10/13]
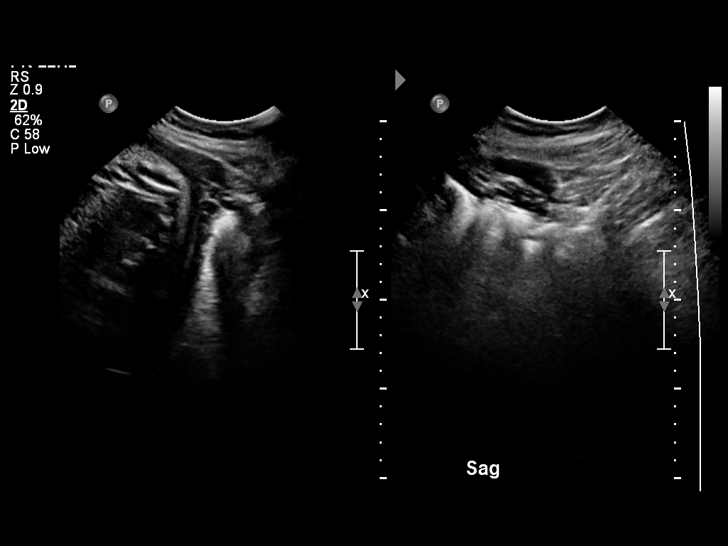
[im 11/13]
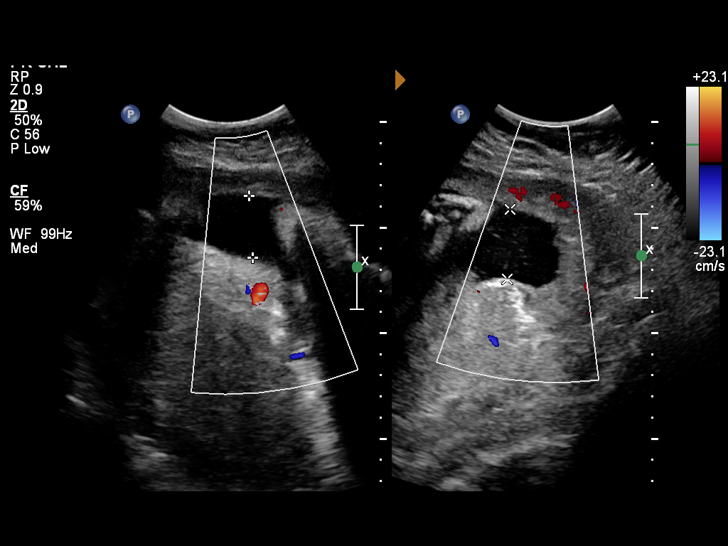
[im 12/13]
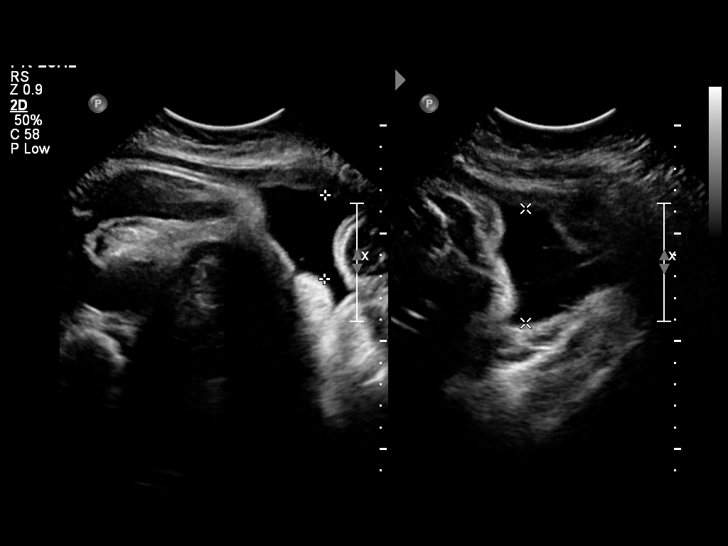
[im 13/13]
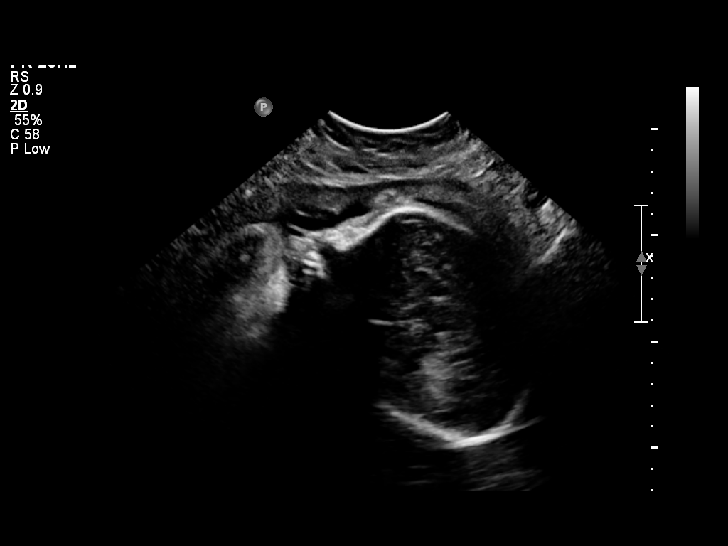

[13 of 13 positions shown; findings below may reference images not displayed]

OBSTETRICS REPORT
                      (Signed Final 06/06/2011 [DATE])

 Order#:         [id number]
                 57_O
Procedures

 [HOSPITAL]                                         76815.0
Indications

 Postdate pregnancy (40-42 weeks)
 Assess amniotic fluid volume
 Assess fetal well being
Fetal Evaluation

 Fetal Heart Rate:  147                         bpm
 Cardiac Activity:  Observed
 Presentation:      Cephalic
 Placenta:          Posterior, above cervical
                    os

 Amniotic Fluid
 AFI FV:      Subjectively within normal limits
 AFI Sum:     15.39   cm      67   %Tile     Larg Pckt:   5.29   cm
 RUQ:   2.93   cm    RLQ:    3.88   cm    LUQ:   5.29    cm   LLQ:    3.29   cm
Biophysical Evaluation

 Amniotic F.V:   Pocket => 2 cm two         F. Tone:        Observed
                 planes
 F. Movement:    Observed                   Score:          [DATE]
 F. Breathing:   Observed
Gestational Age

 LMP:           39w 3d       Date:   09/03/10                 EDD:   06/10/11
 Best:          40w 2d    Det. By:   U/S    (02/05/11)        EDD:   06/04/11
Cervix Uterus Adnexa

 Cervix:       Not visualized (advanced GA >34 wks)

 Adnexa:     No abnormality visualized.
Impression

 Single living intrauterine pregnancy in cephalic presentation.
 The estimated gestational age is 40w 2d based on U/S
 (02/05/11). The biophysical profile is [DATE]. Amniotic fluid index
 is 15.39 cm.

 questions or concerns.

## 2013-12-07 ENCOUNTER — Telehealth: Payer: Self-pay | Admitting: Pediatrics

## 2013-12-07 DIAGNOSIS — Z207 Contact with and (suspected) exposure to pediculosis, acariasis and other infestations: Secondary | ICD-10-CM

## 2013-12-07 DIAGNOSIS — Z2089 Contact with and (suspected) exposure to other communicable diseases: Secondary | ICD-10-CM

## 2013-12-07 MED ORDER — PERMETHRIN 5 % EX CREA: 1.0000 "application " | TOPICAL_CREAM | Freq: Once | CUTANEOUS | Status: DC

## 2013-12-07 NOTE — Telephone Encounter (Signed)
Regina Castaneda's son Regina Castaneda(Regina Castaneda) [facility] and diagnosed with scabies.  All household contacts are being treated with permethrin for exposure to scabies.

## 2014-02-20 ENCOUNTER — Encounter (HOSPITAL_COMMUNITY): Payer: Self-pay

## 2016-11-10 ENCOUNTER — Other Ambulatory Visit (HOSPITAL_COMMUNITY): Payer: Self-pay | Admitting: *Deleted

## 2016-11-10 DIAGNOSIS — N6310 Unspecified lump in the right breast, unspecified quadrant: Secondary | ICD-10-CM

## 2016-11-20 ENCOUNTER — Ambulatory Visit (HOSPITAL_COMMUNITY): Payer: Medicaid Other

## 2016-11-20 ENCOUNTER — Encounter (HOSPITAL_COMMUNITY): Payer: Self-pay

## 2016-11-20 ENCOUNTER — Ambulatory Visit
Admission: RE | Admit: 2016-11-20 | Discharge: 2016-11-20 | Disposition: A | Discharge status: Home / Self Care | Payer: Self-pay | Source: Ambulatory Visit | Attending: Obstetrics and Gynecology | Admitting: Obstetrics and Gynecology

## 2016-11-20 ENCOUNTER — Ambulatory Visit (HOSPITAL_COMMUNITY)
Admission: RE | Admit: 2016-11-20 | Discharge: 2016-11-20 | Disposition: A | Discharge status: Home / Self Care | Payer: Self-pay | Source: Ambulatory Visit | Attending: Obstetrics and Gynecology | Admitting: Obstetrics and Gynecology

## 2016-11-20 ENCOUNTER — Ambulatory Visit
Admission: RE | Admit: 2016-11-20 | Discharge: 2016-11-20 | Disposition: A | Discharge status: Home / Self Care | Payer: No Typology Code available for payment source | Source: Ambulatory Visit | Attending: Obstetrics and Gynecology | Admitting: Obstetrics and Gynecology

## 2016-11-20 VITALS — BP 116/68 | HR 65 | Ht 62.0 in | Wt 191.5 lb

## 2016-11-20 DIAGNOSIS — Z1239 Encounter for other screening for malignant neoplasm of breast: Secondary | ICD-10-CM

## 2016-11-20 DIAGNOSIS — N6452 Nipple discharge: Secondary | ICD-10-CM

## 2016-11-20 DIAGNOSIS — N6310 Unspecified lump in the right breast, unspecified quadrant: Secondary | ICD-10-CM

## 2016-11-20 NOTE — Progress Notes (Signed)
Complaints of a pimple appearing lump on her right nipple that a white discharge comes out of when expressed.   Pap Smear: Pap smear not completed today. [facility] and normal. Per patient has no history of an abnormal Pap smear.  Physical exam: Breasts Breasts symmetrical. No skin abnormalities left breast. A pimple observed on right nipple. No nipple retraction bilateral breasts. No nipple discharge left breast. A thick white discharge observed when expressed right breast. Unable to get enough discharge to send a sample to Cytology. No lymphadenopathy. No lumps palpated left breast. Palpated a lump versus thickened skin within the right breast at 3 o'clock next to areola. Complaints of tenderness when palpated right breast at 3 o'clock next to areola. [facility] and possible right breast ultrasound. Appointment scheduled for Thursday, November 20, 2016 at 0920.        Pelvic/Bimanual No Pap smear completed today since last Pap smear was 09/19/2016. Pap smear not indicated per BCCCP guidelines.   Smoking History: Patient has never smoked.  Patient Navigation: Patient education provided. Access to services [facility]. Spanish interpreter provided.    Used Spanish interpreter Radiation protection practitionerAngie Segarra from [facility].

## 2016-11-20 NOTE — Patient Instructions (Addendum)
Explained breast self awareness with [name]. Patient did not need a Pap smear today due to last Pap smear was 09/19/2016. Let her know BCCCP will cover Pap smears every 3 years unless has a history of abnormal Pap smears. [facility] diagnostic mamm[facility] and possible right breast ultrasound. Appointment scheduled for Thursday, November 20, 2016 at 0920. [name] verbalized understanding.  Taylin Leder, Kathaleen Maserhristine Poll, RN 9:08 AM

## 2017-02-10 ENCOUNTER — Encounter (HOSPITAL_COMMUNITY): Payer: Self-pay | Admitting: Emergency Medicine

## 2017-02-10 ENCOUNTER — Ambulatory Visit (HOSPITAL_COMMUNITY)
Admission: EM | Admit: 2017-02-10 | Discharge: 2017-02-10 | Disposition: A | Discharge status: Home / Self Care | Payer: Self-pay | Attending: Emergency Medicine | Admitting: Emergency Medicine

## 2017-02-10 DIAGNOSIS — N760 Acute vaginitis: Secondary | ICD-10-CM | POA: Insufficient documentation

## 2017-02-10 DIAGNOSIS — R300 Dysuria: Secondary | ICD-10-CM | POA: Insufficient documentation

## 2017-02-10 DIAGNOSIS — B9689 Other specified bacterial agents as the cause of diseases classified elsewhere: Secondary | ICD-10-CM

## 2017-02-10 DIAGNOSIS — Z79899 Other long term (current) drug therapy: Secondary | ICD-10-CM | POA: Insufficient documentation

## 2017-02-10 LAB — POCT URINALYSIS DIP (DEVICE)
BILIRUBIN URINE: NEGATIVE
Glucose, UA: NEGATIVE mg/dL
HGB URINE DIPSTICK: NEGATIVE
Ketones, ur: NEGATIVE mg/dL
Leukocytes, UA: NEGATIVE
NITRITE: NEGATIVE
PH: 7 (ref 5.0–8.0)
PROTEIN: NEGATIVE mg/dL
Specific Gravity, Urine: 1.01 (ref 1.005–1.030)
Urobilinogen, UA: 0.2 mg/dL (ref 0.0–1.0)

## 2017-02-10 LAB — POCT PREGNANCY, URINE: Preg Test, Ur: NEGATIVE

## 2017-02-10 MED ORDER — METRONIDAZOLE 500 MG PO TABS: 500.0000 mg | ORAL_TABLET | Freq: Two times a day (BID) | ORAL | 0 refills | Status: AC

## 2017-02-10 MED ORDER — PHENAZOPYRIDINE HCL 200 MG PO TABS: 200.0000 mg | ORAL_TABLET | Freq: Three times a day (TID) | ORAL | 0 refills | Status: DC | PRN

## 2017-02-10 NOTE — ED Triage Notes (Signed)
With spanish interpreter. Pt states "i think I have a urinary infection, when I try to urinate, I feel an intense burning sensation"

## 2017-02-10 NOTE — ED Provider Notes (Signed)
HPI  SUBJECTIVE:  Regina Castaneda is a 41 y.o. female who presents with dysuria for the past 3 days. She does report vaginal itching. She no aggravating or alleviating factors. She has not tried anything for this. No urinary urgency, frequency, cloudy or odorous urine, hematuria. No fevers, abdominal, back, pelvic pain. No vaginal odor, discharge, bleeding. No genital rash. No antibiotics in the past month. She is in a long-term monogamous relationship with a female who is asymptomatic. STDs are not a concern today. No antipyretic in the past 6-8 hours. No perfumed body washes or soaps. Past medical history of UTI. No history of pyelonephritis, nephrolithiasis. No history of PID, gonorrhea, chlamydia, HIV, HSV, syphilis, Trichomonas, BV, yeast, diabetes, hypertension. LMP: 10/1. PMD: None. All history obtained through language line.   Past Medical History:  Diagnosis Date  . Language barrier    spanish  . Late prenatal care   . No pertinent past medical history   . Obese     Past Surgical History:  Procedure Laterality Date  . NO PAST SURGERIES      Family History  Problem Relation Age of Onset  . Peripheral vascular disease Mother   . Asthma Son     Social History  Substance Use Topics  . Smoking status: Never Smoker  . Smokeless tobacco: Never Used  . Alcohol use No    No current facility-administered medications for this encounter.   Current Outpatient Prescriptions:  .  ibuprofen (ADVIL,MOTRIN) 600 MG tablet, Take 600 mg by mouth every 6 (six) hours as needed. pain, Disp: , Rfl:  .  metroNIDAZOLE (FLAGYL) 500 MG tablet, Take 1 tablet (500 mg total) by mouth 2 (two) times daily., Disp: 14 tablet, Rfl: 0 .  phenazopyridine (PYRIDIUM) 200 MG tablet, Take 1 tablet (200 mg total) by mouth 3 (three) times daily as needed for pain., Disp: 6 tablet, Rfl: 0 .  Prenatal Vit-Fe Fumarate-FA (PRENATAL MULTIVITAMIN) TABS, Take 1 tablet by mouth daily., Disp: , Rfl:   No Known  Allergies   ROS  As noted in HPI.   Physical Exam  BP 127/73   Pulse 74   Temp 98.1 F (36.7 C) (Oral)   Resp 16   LMP 01/19/2017   SpO2 100%   Constitutional: Well developed, well nourished, no acute distress Eyes:  EOMI, conjunctiva normal bilaterally HENT: Normocephalic, atraumatic,mucus membranes moist Respiratory: Normal inspiratory effort Cardiovascular: Normal rate GI: nondistended,  no suprapubic, flank tenderness Back: No CVA tenderness GU: Normal external genitalia. Positive odorous vaginal discharge. Friable os. No CMT. No adnexal tenderness. No adnexal masses. Uterus smooth, nontender. Chaperone present during exam. skin: No rash, skin intact Musculoskeletal: no deformities Neurologic: Alert & oriented x 3, no focal neuro deficits Psychiatric: Speech and behavior appropriate   ED Course   Medications - No data to display  Orders Placed This Encounter  Procedures  . Urine culture    Standing Status:   Standing    Number of Occurrences:   1  . POCT urinalysis dip (device)    Standing Status:   Standing    Number of Occurrences:   1  . Pregnancy, urine POC    Standing Status:   Standing    Number of Occurrences:   1    Results for orders placed or performed during the hospital encounter of 02/10/17 (from the past 24 hour(s))  POCT urinalysis dip (device)     Status: None   Collection Time: 02/10/17  4:07 PM  Result  Value Ref Range   Glucose, UA NEGATIVE NEGATIVE mg/dL   Bilirubin Urine NEGATIVE NEGATIVE   Ketones, ur NEGATIVE NEGATIVE mg/dL   Specific Gravity, Urine 1.010 1.005 - 1.030   Hgb urine dipstick NEGATIVE NEGATIVE   pH 7.0 5.0 - 8.0   Protein, ur NEGATIVE NEGATIVE mg/dL   Urobilinogen, UA 0.2 0.0 - 1.0 mg/dL   Nitrite NEGATIVE NEGATIVE   Leukocytes, UA NEGATIVE NEGATIVE  Pregnancy, urine POC     Status: None   Collection Time: 02/10/17  4:09 PM  Result Value Ref Range   Preg Test, Ur NEGATIVE NEGATIVE   No results found.  ED  Clinical Impression  Dysuria  Bacterial vaginosis   ED Assessment/Plan   Patient not pregnant. Urine dip negative for UTI.  Using the language line, discussed that patient does not have glucose in her urine nor does she have a urinary tract infection however we will send her urine off for culture to confirm this. Feel that her symptoms are most likely from  BV, so we'll send home with Flagyl. Also home with Pyridium. Patient is in a 5715 year monogamous relationship with her husband, so do not think that we need to treat empirically for gonorrhea and chlamydia today although these tests were sent. We'll provide primary care referral list for routine care.  Discussed labs,  MDM, plan and followup with patient  Discussed sn/sx that should prompt return to the ED. Patient agrees with plan.   Spent over 25 minutes with the patient obtaining H&P, performed physical exam, explaining labs and medical decision making, plan for follow-up.  Meds ordered this encounter  Medications  . metroNIDAZOLE (FLAGYL) 500 MG tablet    Sig: Take 1 tablet (500 mg total) by mouth 2 (two) times daily.    Dispense:  14 tablet    Refill:  0  . phenazopyridine (PYRIDIUM) 200 MG tablet    Sig: Take 1 tablet (200 mg total) by mouth 3 (three) times daily as needed for pain.    Dispense:  6 tablet    Refill:  0    *This clinic note was created using Scientist, clinical (histocompatibility and immunogenetics)Dragon dictation software. Therefore, there may be occasional mistakes despite careful proofreading.  ?   Domenick GongMortenson, Kameryn Davern, MD 02/11/17 [phone]

## 2017-02-10 NOTE — Discharge Instructions (Signed)
Take the medication as written. Give us a working phone number so that we can contact you if needed. Refrain from sexual contact until you know your results and your partner(s) are treated if necessary. Return to the ER if you get worse, have a fever >100.4, or for any concerns.  ° ° °Below is a list of primary care practices who are taking new patients for you to follow-up with. °[facility] °[address] °[place], [place] [address] °[phone] ° °[facility] °[phone] °[address] °[place] [address] ° °[facility]: [address] °[place] [address]  °[phone] ° °[facility]: [address] °[place] [address]   °[phone] ° °[facility]: [address] °[place] [address]  °[phone] ° °[facility] : [address] [place] [address] °[phone] ° °[facility]: [address] °[place] [address] °[phone] ° °[facility]: [address] °[place] [address] °[phone] ° °Dr. Mahima Pandey [address] [facility] [place] [address]  °[phone] ° °Dr. George Osei-Bonsu, [facility]. [address] , [place] [address]  °[phone] ° °Go to www.goodrx.com to look up your medications. This will give you a list of where you can find your prescriptions at the most affordable prices. Or ask the pharmacist what the cash price is, or if they have any other discount programs available to help make your medication more affordable. This can be less expensive than what you would pay with insurance.   °

## 2017-02-11 LAB — CERVICOVAGINAL ANCILLARY ONLY
Bacterial vaginitis: NEGATIVE
CANDIDA VAGINITIS: NEGATIVE
CHLAMYDIA, DNA PROBE: NEGATIVE
NEISSERIA GONORRHEA: NEGATIVE
TRICH (WINDOWPATH): NEGATIVE

## 2017-02-13 ENCOUNTER — Telehealth (HOSPITAL_COMMUNITY): Payer: Self-pay | Admitting: Internal Medicine

## 2017-02-13 LAB — URINE CULTURE

## 2017-02-13 NOTE — Telephone Encounter (Signed)
Clinical staff, please let patient know that urine culture was positive for Staph germ, sensitive to nitrofurantoin.  Please send rx for nitrofurantoin 100mg  bid x 5d #10 no refills.  Recheck for further evaluation if symptoms are not improving.  LM

## 2017-02-14 ENCOUNTER — Telehealth: Payer: Self-pay

## 2017-02-14 NOTE — Telephone Encounter (Signed)
Reached Daughter who speaks [place]. She states that Dr Rennie PlowmanMurray's office had not [facility] [place] [phone] Nitrofurantoin 100mg  BID x 5 days per Dietrich PatesHina Khatri PA

## 2017-02-14 NOTE — Telephone Encounter (Signed)
Instructed to call pt to verify if prescription for Macrobid was call in for pt. For [facility] ED 02/11/17  Letter to home. Not able to contact pt.. Per Dietrich PatesHina Khatri PA

## 2017-06-21 ENCOUNTER — Other Ambulatory Visit: Payer: Self-pay

## 2017-06-21 ENCOUNTER — Encounter (HOSPITAL_COMMUNITY): Payer: Self-pay | Admitting: *Deleted

## 2017-06-21 ENCOUNTER — Ambulatory Visit (HOSPITAL_COMMUNITY)
Admission: EM | Admit: 2017-06-21 | Discharge: 2017-06-21 | Disposition: A | Discharge status: Home / Self Care | Payer: Self-pay | Attending: Urgent Care | Admitting: Urgent Care

## 2017-06-21 ENCOUNTER — Telehealth (HOSPITAL_COMMUNITY): Payer: Self-pay | Admitting: *Deleted

## 2017-06-21 DIAGNOSIS — R059 Cough, unspecified: Secondary | ICD-10-CM

## 2017-06-21 DIAGNOSIS — R52 Pain, unspecified: Secondary | ICD-10-CM

## 2017-06-21 DIAGNOSIS — R05 Cough: Secondary | ICD-10-CM

## 2017-06-21 DIAGNOSIS — R69 Illness, unspecified: Secondary | ICD-10-CM

## 2017-06-21 DIAGNOSIS — J111 Influenza due to unidentified influenza virus with other respiratory manifestations: Secondary | ICD-10-CM

## 2017-06-21 MED ORDER — BENZONATATE 100 MG PO CAPS: 100.0000 mg | ORAL_CAPSULE | Freq: Three times a day (TID) | ORAL | 0 refills | Status: DC | PRN

## 2017-06-21 MED ORDER — PSEUDOEPHEDRINE HCL ER 120 MG PO TB12: 120.0000 mg | ORAL_TABLET | Freq: Two times a day (BID) | ORAL | 3 refills | Status: DC

## 2017-06-21 NOTE — Discharge Instructions (Signed)
Tome 500mg de Tylenol con ibuprofen 400-600mg cada 6 horas con comida para dolor y inflammacion. Para el dolor de garganta intente usar un té de miel. Use 3 cucharaditas de miel con jugo exprimido de medio limón. Coloque las piezas de jengibre afeitadas en 1/2 - 1 taza de agua y caliente sobre la estufa. Luego mezcle los ingredientes y repita cada 4 horas. ° °

## 2017-06-21 NOTE — ED Provider Notes (Signed)
  MRN: [id number] DOB: Mar 09, 1976  Subjective:   [name] is a 42 y.o. female presenting for 3 day history of body aches, mild headaches, mild sore throat, productive cough that elicits chest and abdominal pain and back pain. Has also had nausea without vomiting. Has tried Flanax with minimal relief. Denies smoking cigarettes. Denies fever, rashes.   Regina HesselbachMaria has No Known Allergies.  Regina HesselbachMaria  has a past medical history of Language barrier, Late prenatal care, No pertinent past medical history, and Obese. Denies past surgical history.   Objective:   Vitals: BP (!) 117/59   Pulse 88   Temp 98.5 F (36.9 C) (Oral)   Resp 18   LMP 06/13/2017 (Approximate)   SpO2 98%   Breastfeeding? No   Physical Exam  Constitutional: She is oriented to person, place, and time. She appears well-developed and well-nourished.  HENT:  TM's intact bilaterally, no effusions or erythema. Nasal turbinates pink, dry, nasal passages patent. No sinus tenderness. Oropharynx with post-nasal drainage, mucous membranes moist.   Eyes: Right eye exhibits no discharge. Left eye exhibits no discharge.  Neck: Normal range of motion. Neck supple.  Cardiovascular: Normal rate, regular rhythm and intact distal pulses. Exam reveals no gallop and no friction rub.  No murmur heard. Pulmonary/Chest: No respiratory distress. She has no wheezes. She has no rales.  Lymphadenopathy:    She has no cervical adenopathy.  Neurological: She is alert and oriented to person, place, and time.  Skin: Skin is warm and dry.  Psychiatric: She has a normal mood and affect.   Assessment and Plan :   Influenza-like illness  Cough  Body aches  Will manage for flu like illness. Unable to start Tamiflu, use supportive care. Return-to-clinic precautions discussed, patient verbalized understanding.    Wallis BambergMani, Jacquelene Kopecky, [place]-C 06/21/17 1942

## 2017-06-21 NOTE — ED Triage Notes (Signed)
C/O cough, congestion, low back pain, body aches x 3 days; has felt feverish.

## 2017-06-21 NOTE — Telephone Encounter (Signed)
[facility] [place]

## 2018-02-07 ENCOUNTER — Encounter (HOSPITAL_COMMUNITY): Payer: Self-pay | Admitting: Emergency Medicine

## 2018-02-07 ENCOUNTER — Ambulatory Visit (HOSPITAL_COMMUNITY)
Admission: EM | Admit: 2018-02-07 | Discharge: 2018-02-07 | Disposition: A | Discharge status: Home / Self Care | Payer: Self-pay | Attending: Internal Medicine | Admitting: Internal Medicine

## 2018-02-07 DIAGNOSIS — R300 Dysuria: Secondary | ICD-10-CM | POA: Insufficient documentation

## 2018-02-07 LAB — POCT URINALYSIS DIP (DEVICE)
BILIRUBIN URINE: NEGATIVE
GLUCOSE, UA: NEGATIVE mg/dL
KETONES UR: NEGATIVE mg/dL
Nitrite: NEGATIVE
Protein, ur: NEGATIVE mg/dL
Specific Gravity, Urine: 1.015 (ref 1.005–1.030)
Urobilinogen, UA: 0.2 mg/dL (ref 0.0–1.0)
pH: 6 (ref 5.0–8.0)

## 2018-02-07 LAB — POCT PREGNANCY, URINE: PREG TEST UR: NEGATIVE

## 2018-02-07 MED ORDER — NITROFURANTOIN MONOHYD MACRO 100 MG PO CAPS: 100.0000 mg | ORAL_CAPSULE | Freq: Two times a day (BID) | ORAL | 0 refills | Status: AC

## 2018-02-07 MED ORDER — PHENAZOPYRIDINE HCL 200 MG PO TABS: 200.0000 mg | ORAL_TABLET | Freq: Three times a day (TID) | ORAL | 0 refills | Status: DC | PRN

## 2018-02-07 NOTE — Discharge Instructions (Signed)
Urine showed evidence of infection. We are treating you with macrobid twice daily for 5 days. Be sure to take full course. Stay hydrated- urine should be pale yellow to clear. May use pyridium three times a day as needed for burning.   Please return or follow up with your primary provider if symptoms not improving with treatment. Please return sooner if you have worsening of symptoms or develop fever, nausea, vomiting, abdominal pain, back pain, lightheadedness, dizziness.

## 2018-02-07 NOTE — ED Triage Notes (Signed)
Burning with urination that started one week ago.  Patient says lower abdomen hurts

## 2018-02-07 NOTE — ED Provider Notes (Signed)
[facility]    CSN: [id number] Arrival date & time: 02/07/18  1006     History   Chief Complaint Chief Complaint  Patient presents with  . Urinary Tract Infection    HPI Regina Castaneda is a 42 y.o. female no significant past medical history presenting today for evaluation of possible UTI.  Patient states that for the past week she has had dysuria and increased frequency.  Symptoms began 1 week ago.  When they first started for the first 3 days she was having hematuria and urethral irritation.  She has had some mild low back pain.  Denies fevers, nausea, vomiting, abdominal pain.  Last menstrual period was around 9/25.  She has not taken any over-the-counter medicines for her symptoms.  HPI  Past Medical History:  Diagnosis Date  . Language barrier    spanish  . Late prenatal care   . No pertinent past medical history   . Obese     There are no active problems to display for this patient.   Past Surgical History:  Procedure Laterality Date  . NO PAST SURGERIES      OB History    Gravida  3   Para  3   Term  3   Preterm  0   AB      Living  3     SAB      TAB      Ectopic      Multiple      Live Births  3            Home Medications    Prior to Admission medications   Medication Sig Start Date End Date Taking? Authorizing Provider  ibuprofen (ADVIL,MOTRIN) 600 MG tablet Take 600 mg by mouth every 6 (six) hours as needed. pain    [provider]  nitrofurantoin, macrocrystal-monohydrate, (MACROBID) 100 MG capsule Take 1 capsule (100 mg total) by mouth 2 (two) times daily for 5 days. 02/07/18 02/12/18  Sylvana Bonk C, PA-C  phenazopyridine (PYRIDIUM) 200 MG tablet Take 1 tablet (200 mg total) by mouth 3 (three) times daily as needed for pain. 02/07/18   Tristin Vandeusen C, PA-C  Prenatal Vit-Fe Fumarate-FA (PRENATAL MULTIVITAMIN) TABS Take 1 tablet by mouth daily.    [provider]    Family History Family  History  Problem Relation Age of Onset  . Peripheral vascular disease Mother   . Asthma Son     Social History Social History   Tobacco Use  . Smoking status: Never Smoker  . Smokeless tobacco: Never Used  Substance Use Topics  . Alcohol use: No  . Drug use: No     Allergies   Patient has no known allergies.   Review of Systems Review of Systems  Constitutional: Negative for fever.  Respiratory: Negative for shortness of breath.   Cardiovascular: Negative for chest pain.  Gastrointestinal: Negative for abdominal pain, diarrhea, nausea and vomiting.  Genitourinary: Positive for dysuria, frequency and hematuria. Negative for flank pain, genital sores, menstrual problem, vaginal bleeding, vaginal discharge and vaginal pain.  Musculoskeletal: Negative for back pain.  Skin: Negative for rash.  Neurological: Negative for dizziness, light-headedness and headaches.     Physical Exam Triage Vital Signs ED Triage Vitals  Enc Vitals Group     BP 02/07/18 1026 115/72     Pulse Rate 02/07/18 1026 74     Resp 02/07/18 1026 18     Temp 02/07/18 1028 97.9 F (  36.6 C)     Temp Source 02/07/18 1028 Oral     SpO2 02/07/18 1026 99 %     Weight --      Height --      Head Circumference --      Peak Flow --      Pain Score 02/07/18 1027 4     Pain Loc --      Pain Edu? --      Excl. in GC? --    No data found.  Updated Vital Signs BP 115/72 (BP Location: Left Arm) Comment (BP Location): large cuff  Pulse 74   Temp 97.9 F (36.6 C) (Oral)   Resp 18   SpO2 99%   Visual Acuity Right Eye Distance:   Left Eye Distance:   Bilateral Distance:    Right Eye Near:   Left Eye Near:    Bilateral Near:     Physical Exam  Constitutional: She is oriented to person, place, and time. She appears well-developed and well-nourished.  No acute distress  HENT:  Head: Normocephalic and atraumatic.  Nose: Nose normal.  Eyes: Conjunctivae are normal.  Neck: Neck supple.    Cardiovascular: Normal rate.  Pulmonary/Chest: Effort normal. No respiratory distress.  Abdominal: She exhibits no distension. There is no tenderness.  Nontender to light deep palpation throughout all 4 quadrants No CVA tenderness  Musculoskeletal: Normal range of motion.  Neurological: She is alert and oriented to person, place, and time.  Skin: Skin is warm and dry.  Psychiatric: She has a normal mood and affect.  Nursing note and vitals reviewed.    UC Treatments / Results  Labs (all labs ordered are listed, but only abnormal results are displayed) Labs Reviewed  POCT URINALYSIS DIP (DEVICE) - Abnormal; Notable for the following components:      Result Value   Hgb urine dipstick MODERATE (*)    Leukocytes, UA MODERATE (*)    All other components within normal limits  URINE CULTURE  POCT PREGNANCY, URINE    EKG None  Radiology No results found.  Procedures Procedures (including critical care time)  Medications Ordered in UC Medications - No data to display  Initial Impression / Assessment and Plan / UC Course  I have reviewed the triage vital signs and the nursing notes.  Pertinent labs & imaging results that were available during my care of the patient were reviewed by me and considered in my medical decision making (see chart for details).     Moderate leuks on UA, will treat for UTI with Macrobid.  Urine culture obtained.  Push fluids.  Pyridium as needed for burning.Discussed strict return precautions. Patient verbalized understanding and is agreeable with plan.  Final Clinical Impressions(s) / UC Diagnoses   Final diagnoses:  Dysuria     Discharge Instructions     Urine showed evidence of infection. We are treating you with macrobid twice daily for 5 days. Be sure to take full course. Stay hydrated- urine should be pale yellow to clear. May use pyridium three times a day as needed for burning.   Please return or follow up with your primary provider  if symptoms not improving with treatment. Please return sooner if you have worsening of symptoms or develop fever, nausea, vomiting, abdominal pain, back pain, lightheadedness, dizziness.   ED Prescriptions    Medication Sig Dispense Auth. Provider   phenazopyridine (PYRIDIUM) 200 MG tablet Take 1 tablet (200 mg total) by mouth 3 (three) times daily as  needed for pain. 6 tablet Sharri Loya C, PA-C   nitrofurantoin, macrocrystal-monohydrate, (MACROBID) 100 MG capsule Take 1 capsule (100 mg total) by mouth 2 (two) times daily for 5 days. 10 capsule Adna Nofziger C, PA-C     Controlled Substance Prescriptions [place] Controlled Substance Registry consulted? Not Applicable   Lew Dawes, [place] 02/07/18 1222

## 2018-02-10 ENCOUNTER — Telehealth: Payer: Self-pay | Admitting: Emergency Medicine

## 2018-02-10 LAB — URINE CULTURE: Culture: >=100000 — AB

## 2018-02-10 NOTE — Telephone Encounter (Signed)
Called patient regarding positive UC results. No answer, left message. Patient was given macrobid at time of visit bacteria is sensitive to Macrobid.

## 2021-06-10 ENCOUNTER — Other Ambulatory Visit: Payer: Self-pay | Admitting: Obstetrics & Gynecology

## 2021-06-10 DIAGNOSIS — Z1231 Encounter for screening mammogram for malignant neoplasm of breast: Secondary | ICD-10-CM

## 2021-07-09 ENCOUNTER — Other Ambulatory Visit: Payer: Self-pay

## 2021-07-09 ENCOUNTER — Ambulatory Visit
Admission: RE | Admit: 2021-07-09 | Discharge: 2021-07-09 | Disposition: A | Discharge status: Home / Self Care | Payer: No Typology Code available for payment source | Source: Ambulatory Visit | Attending: Obstetrics & Gynecology | Admitting: Obstetrics & Gynecology

## 2021-07-09 DIAGNOSIS — Z1231 Encounter for screening mammogram for malignant neoplasm of breast: Secondary | ICD-10-CM

## 2022-04-21 NOTE — L&D Delivery Note (Signed)
In person interpreter Graciela at bedside for epidural consent and placement. Consent read in spanish. Patient verbalized understanding and signed consent form.

## 2022-04-21 NOTE — L&D Delivery Note (Signed)
Delivery Note Regina Castaneda is a 47 y.o. [id number] at [redacted]w[redacted]d admitted for IOL for A1DM/class B (Diagnosed at 18 weeks).   GBS Status:  Negative/-- (06/26 0229)  Labor course: Initial SVE: 1.5/60/-3. Augmentation with: Pitocin, Cytotec, and IP Foley. She then progressed to complete.  ROM: 0h 50m with clear fluid  Birth: After a 10 minute 2nd stage, she delivered a Live born female  Birth Weight:   APGAR: 9, 9  Newborn Delivery   Birth date/time: 11/02/2022 11:59:00 Delivery type: Vaginal, Spontaneous        Delivered via spontaneous vaginal delivery (Presentation: ROA ). Nuchal cord present: No. . Shoulders and body delivered in usual fashion. Infant placed directly on mom's abdomen for bonding/skin-to-skin, baby dried and stimulated. Cord clamped x 2 after 1 minute and cut by FOB.  Cord blood collected. Placenta delivered-Spontaneous with 3 vessels. 20u Pitocin in 500cc LR given as a bolus prior to delivery of placenta.  Fundus firm with massage. Placenta inspected and appears to be intact with a 3 VC.  Sponge and instrument count were correct x2.  Intrapartum complications:  None Anesthesia:  epidural Lacerations:  1st degree Suture Repair: 2.0 vicryl EBL (mL):412.00    Mom to postpartum.  Baby to Couplet care / Skin to Skin. Placenta to L&D   Plans to Breast and bottlefeed Contraception:  unsure Circumcision: declines  Note sent to [facility]: [facility] for pp visit.  Delivery Report:  Review the Delivery Report for details.     Signed: Jacklyn Shell, DNP,CNM 11/02/2022, 12:44 PM

## 2022-06-05 LAB — OB RESULTS CONSOLE VARICELLA ZOSTER ANTIBODY, IGG: Varicella: NON-IMMUNE/NOT IMMUNE

## 2022-06-05 LAB — OB RESULTS CONSOLE ABO/RH: RH Type: POSITIVE

## 2022-06-05 LAB — OB RESULTS CONSOLE GC/CHLAMYDIA
Chlamydia: NEGATIVE
Neisseria Gonorrhea: NEGATIVE

## 2022-06-05 LAB — OB RESULTS CONSOLE HIV ANTIBODY (ROUTINE TESTING): HIV: NONREACTIVE

## 2022-06-05 LAB — OB RESULTS CONSOLE HGB/HCT, BLOOD
HCT: 35 (ref 29–41)
Hemoglobin: 11.6

## 2022-06-05 LAB — OB RESULTS CONSOLE HEPATITIS B SURFACE ANTIGEN: Hepatitis B Surface Ag: NEGATIVE

## 2022-06-05 LAB — OB RESULTS CONSOLE PLATELET COUNT: Platelets: 231

## 2022-06-05 LAB — OB RESULTS CONSOLE ANTIBODY SCREEN: Antibody Screen: NEGATIVE

## 2022-06-05 LAB — OB RESULTS CONSOLE RUBELLA ANTIBODY, IGM: Rubella: IMMUNE

## 2022-06-05 LAB — HEPATITIS C ANTIBODY: HCV Ab: NEGATIVE

## 2022-06-05 LAB — OB RESULTS CONSOLE RPR: RPR: NONREACTIVE

## 2022-06-18 LAB — AFP, SERUM, OPEN SPINA BIFIDA
AFP MoM: 1.56
AFP, Serum: 51.4

## 2022-06-18 LAB — LEAD, BLOOD: Lead, Blood: 7.39

## 2022-06-18 LAB — CYTOLOGY - PAP: Pap: NEGATIVE

## 2022-06-18 LAB — GLUCOSE TOLERANCE, 1 HOUR: GTT, 1 hr: 135

## 2022-06-18 LAB — GLUCOSE TOLERANCE, 3 HOURS

## 2022-06-19 ENCOUNTER — Encounter: Payer: Self-pay | Admitting: *Deleted

## 2022-06-20 ENCOUNTER — Ambulatory Visit (INDEPENDENT_AMBULATORY_CARE_PROVIDER_SITE_OTHER): Payer: Self-pay | Admitting: Obstetrics and Gynecology

## 2022-06-20 ENCOUNTER — Encounter: Payer: Self-pay | Admitting: Obstetrics and Gynecology

## 2022-06-20 VITALS — BP 118/64 | HR 68 | Wt 194.4 lb

## 2022-06-20 DIAGNOSIS — Z3A19 19 weeks gestation of pregnancy: Secondary | ICD-10-CM

## 2022-06-20 DIAGNOSIS — O0990 Supervision of high risk pregnancy, unspecified, unspecified trimester: Secondary | ICD-10-CM | POA: Insufficient documentation

## 2022-06-20 DIAGNOSIS — O09529 Supervision of elderly multigravida, unspecified trimester: Secondary | ICD-10-CM | POA: Insufficient documentation

## 2022-06-20 DIAGNOSIS — O0992 Supervision of high risk pregnancy, unspecified, second trimester: Secondary | ICD-10-CM

## 2022-06-20 DIAGNOSIS — O09522 Supervision of elderly multigravida, second trimester: Secondary | ICD-10-CM

## 2022-06-20 DIAGNOSIS — O24419 Gestational diabetes mellitus in pregnancy, unspecified control: Secondary | ICD-10-CM | POA: Insufficient documentation

## 2022-06-20 MED ORDER — TERCONAZOLE 0.8 % VA CREA: 1.0000 | TOPICAL_CREAM | Freq: Every day | VAGINAL | 0 refills | Status: DC

## 2022-06-20 MED ORDER — ASPIRIN 81 MG PO TBEC: 81.0000 mg | DELAYED_RELEASE_TABLET | Freq: Every day | ORAL | 2 refills | Status: DC

## 2022-06-20 NOTE — Progress Notes (Signed)
  Subjective:    Regina Castaneda is a [id number] 25w5dbeing seen today for her first obstetrical visit.  Patient is [facility] care from [facility] due to recent diagnosis of GDM on early screening. Her obstetrical history is significant for advanced maternal age and obesity. Patient does intend to breast feed. Pregnancy history fully reviewed.  Patient reports no complaints.  Vitals:   06/20/22 0911  BP: 118/64  Pulse: 68  Weight: 194 lb 6.4 oz (88.2 kg)    HISTORY: OB History  Gravida Para Term Preterm AB Living  '4 3 3 '$ 0   3  SAB IAB Ectopic Multiple Live Births          3    # Outcome Date GA Lbr Len/2nd Weight Sex Delivery Anes PTL Lv  4 Current           3 Term 06/15/11 479w4d3:49 / 00:11 7 lb 1.4 oz (3.215 kg) M Vag-Spont None  LIV     Birth Comments: wnl  2 Term 2006 [id number]:30 7 lb 8 oz (3.402 kg) M Vag-Spont None  LIV  1 Term 2002 40w22w0d00 7 lb (3.175 kg) F Vag-Spont None  LIV   Past Medical History:  Diagnosis Date   Gestational diabetes    Language barrier    spanish   Late prenatal care    No pertinent past medical history    Obese    Past Surgical History:  Procedure Laterality Date   NO PAST SURGERIES     Family History  Problem Relation Age of Onset   Peripheral vascular disease Mother    Asthma Son    Breast cancer Neg Hx      Exam    Uterus:   20-weeks      Assessment:    Pregnancy: [id number] Active Problem List   Diagnosis Date Noted   Supervision of high risk pregnancy, antepartum 06/20/2022   AMA (advanced maternal age) multigravida 35+ 06/20/2022   GDM (gestational diabetes mellitus) 06/20/2022        Plan:     Initial labs drawn. Prenatal vitamins. Problem list reviewed and updated. Genetic Screening discussed : results reviewed.  Ultrasound discussed; fetal survey: ordered. Patient referred to diabetic educator Feta echo ordered Rx ASA provided Patient is undecided on contraception and plans no  future pregnancies  Follow up in 4 weeks. 50% of 30 min visit spent on counseling and coordination of care.     Joslynne Klatt 06/20/2022

## 2022-06-24 ENCOUNTER — Encounter: Payer: Self-pay | Admitting: *Deleted

## 2022-07-01 ENCOUNTER — Other Ambulatory Visit: Payer: No Typology Code available for payment source

## 2022-07-02 ENCOUNTER — Ambulatory Visit: Payer: No Typology Code available for payment source | Admitting: *Deleted

## 2022-07-02 ENCOUNTER — Ambulatory Visit: Payer: No Typology Code available for payment source | Attending: Obstetrics and Gynecology

## 2022-07-02 VITALS — BP 107/55 | HR 72

## 2022-07-02 DIAGNOSIS — O0990 Supervision of high risk pregnancy, unspecified, unspecified trimester: Secondary | ICD-10-CM

## 2022-07-02 DIAGNOSIS — O99212 Obesity complicating pregnancy, second trimester: Secondary | ICD-10-CM

## 2022-07-02 DIAGNOSIS — O24419 Gestational diabetes mellitus in pregnancy, unspecified control: Secondary | ICD-10-CM

## 2022-07-02 DIAGNOSIS — O24410 Gestational diabetes mellitus in pregnancy, diet controlled: Secondary | ICD-10-CM

## 2022-07-02 DIAGNOSIS — O09522 Supervision of elderly multigravida, second trimester: Secondary | ICD-10-CM | POA: Insufficient documentation

## 2022-07-02 DIAGNOSIS — O35BXX0 Maternal care for other (suspected) fetal abnormality and damage, fetal cardiac anomalies, not applicable or unspecified: Secondary | ICD-10-CM

## 2022-07-02 DIAGNOSIS — E669 Obesity, unspecified: Secondary | ICD-10-CM

## 2022-07-02 DIAGNOSIS — Z3A21 21 weeks gestation of pregnancy: Secondary | ICD-10-CM

## 2022-07-03 ENCOUNTER — Other Ambulatory Visit: Payer: Self-pay | Admitting: *Deleted

## 2022-07-03 DIAGNOSIS — O24419 Gestational diabetes mellitus in pregnancy, unspecified control: Secondary | ICD-10-CM

## 2022-07-03 DIAGNOSIS — O09522 Supervision of elderly multigravida, second trimester: Secondary | ICD-10-CM

## 2022-07-14 ENCOUNTER — Other Ambulatory Visit: Payer: Self-pay

## 2022-07-14 ENCOUNTER — Ambulatory Visit (INDEPENDENT_AMBULATORY_CARE_PROVIDER_SITE_OTHER): Payer: Self-pay | Admitting: Obstetrics and Gynecology

## 2022-07-14 VITALS — BP 111/70 | HR 73 | Wt 192.7 lb

## 2022-07-14 DIAGNOSIS — O09522 Supervision of elderly multigravida, second trimester: Secondary | ICD-10-CM

## 2022-07-14 DIAGNOSIS — O0992 Supervision of high risk pregnancy, unspecified, second trimester: Secondary | ICD-10-CM

## 2022-07-14 DIAGNOSIS — N9089 Other specified noninflammatory disorders of vulva and perineum: Secondary | ICD-10-CM

## 2022-07-14 DIAGNOSIS — O24419 Gestational diabetes mellitus in pregnancy, unspecified control: Secondary | ICD-10-CM

## 2022-07-14 DIAGNOSIS — Z3A23 23 weeks gestation of pregnancy: Secondary | ICD-10-CM

## 2022-07-14 DIAGNOSIS — Z789 Other specified health status: Secondary | ICD-10-CM

## 2022-07-14 DIAGNOSIS — R634 Abnormal weight loss: Secondary | ICD-10-CM

## 2022-07-14 DIAGNOSIS — O0990 Supervision of high risk pregnancy, unspecified, unspecified trimester: Secondary | ICD-10-CM

## 2022-07-14 LAB — GLUCOSE, CAPILLARY: Glucose-Capillary: 106 mg/dL — ABNORMAL HIGH (ref 70–99)

## 2022-07-14 MED ORDER — MICONAZOLE NITRATE 2 % VA CREA: 1.0000 | TOPICAL_CREAM | Freq: Every day | VAGINAL | 2 refills | Status: DC

## 2022-07-14 NOTE — Progress Notes (Signed)
   PRENATAL VISIT NOTE  Subjective:  Regina Castaneda is a 47 y.o. [phone] at [redacted]w[redacted]d being seen today for ongoing prenatal care.  She is currently monitored for the following issues for this high-risk pregnancy and has Supervision of high risk pregnancy, antepartum; AMA (advanced maternal age) multigravida 35+; GDM diagnosed at 52 weeks; and Language barrier on their problem list.  Patient reports  vulvar irritation .  Contractions: Not present. Vag. Bleeding: None.  Movement: Present. Denies leaking of fluid.   The following portions of the patient's history were reviewed and updated as appropriate: allergies, current medications, past family history, past medical history, past social history, past surgical history and problem list.   Objective:   Vitals:   07/14/22 0959  BP: 111/70  Pulse: 73  Weight: 192 lb 11.2 oz (87.4 kg)    Fetal Status: Fetal Heart Rate (bpm): 145   Movement: Present     General:  Alert, oriented and cooperative. Patient is in no acute distress.  Skin: Skin is warm and dry. No rash noted.   Cardiovascular: Normal heart rate noted  Respiratory: Normal respiratory effort, no problems with respiration noted  Abdomen: Soft, gravid, appropriate for gestational age.  Pain/Pressure: Absent     Pelvic: Cervical exam deferred        Extremities: Normal range of motion.  Edema: None  Mental Status: Normal mood and affect. Normal behavior. Normal judgment and thought content.   Assessment and Plan:  Pregnancy: G4P3003 at [redacted]w[redacted]d 1. Vulvar irritation Had this in past and the terazol 3 worked well but it was expensive. Will send in monistat 7 to see if this works. Pt is self pay  2. Multigravida of advanced maternal age in second trimester Low risk panorama, neg afp and mfm anatomy u/s except for EIF  3. Language barrier In person interpreter used  4. Gestational diabetes mellitus (GDM) in second trimester, gestational diabetes method of control unspecified Patient  did not know about the 3/12 education visit; pt has not has DM in the past. Random CBG 106 this morning and she had oatmeal only this morning. Pt is eating and drinking well and states the weight loss due to changing her diet especially trying to cut out as much sugar as possible  5. Supervision of high risk pregnancy, antepartum 3/13: 43%, 422gm, ac 68%, afi wnl  6. Weight loss See above  Preterm labor symptoms and general obstetric precautions including but not limited to vaginal bleeding, contractions, leaking of fluid and fetal movement were reviewed in detail with the patient. Please refer to After Visit Summary for other counseling recommendations.   Return in about 23 days (around 08/06/2022) for in person, high risk ob, md visit.  Future Appointments  Date Time Provider [place]  07/29/2022  8:15 AM [facility]  07/31/2022  3:55 PM Donnamae Jude, MD [facility]  08/06/2022 12:30 PM [facility]URSE [facility]  08/06/2022 12:45 PM [facility]4 [facility]    Aletha Halim, MD

## 2022-07-29 ENCOUNTER — Encounter: Payer: Self-pay | Attending: Obstetrics and Gynecology | Admitting: Registered"

## 2022-07-29 ENCOUNTER — Ambulatory Visit (INDEPENDENT_AMBULATORY_CARE_PROVIDER_SITE_OTHER): Payer: Self-pay | Admitting: Registered"

## 2022-07-29 ENCOUNTER — Other Ambulatory Visit: Payer: Self-pay

## 2022-07-29 DIAGNOSIS — O09522 Supervision of elderly multigravida, second trimester: Secondary | ICD-10-CM | POA: Insufficient documentation

## 2022-07-29 DIAGNOSIS — O24419 Gestational diabetes mellitus in pregnancy, unspecified control: Secondary | ICD-10-CM | POA: Insufficient documentation

## 2022-07-29 DIAGNOSIS — Z3A25 25 weeks gestation of pregnancy: Secondary | ICD-10-CM

## 2022-07-29 DIAGNOSIS — Z713 Dietary counseling and surveillance: Secondary | ICD-10-CM | POA: Insufficient documentation

## 2022-07-29 NOTE — Progress Notes (Signed)
Patient was seen for Gestational Diabetes self-management on 07/29/22  Start time 0821 and End time 0913   Estimated due date: 11/09/22; [redacted]w[redacted]d  Interpreter: #[id number] Cristian   Clinical: Medications: prenatal multivitamin  Medical History: not prior GDM per Pt Labs: OGTT   No results found for: "HGBA1C"    Dietary and Lifestyle History: GDM diagnosed at 18 weeks. Pt states changes after diagnosis of GDM: stopped eating fruits, was eating daily and instead and switched to veggies daily. Pt was eating 3 tortillas per meal, now 1-2 per meal, does not like rice.  Pt states works at [employer] starting at 5 am often only having coffee until her lunch meal ~1 pm. Pt dinner time varies, but usually before 8 pm  Pt states she does not drink sugar sweetened beverages  Physical Activity: ADL  Stress: moderate, works a lot Sleep: around 6 hours, takes a 1-2 hr nap after work  24 hr Recall:  First Meal: coffee (no sugar) Snack:chyote, egg or steak (9-10 am) - usually does not eat breakfast, only 1 x month Second meal: beans, pork and sauce, napoles, tortillas (1) (around 1pm) Snack: Third meal: (6:30) pork and napoles, tortillas (1)  Snack: Beverages: water, black coffee  NUTRITION INTERVENTION  Nutrition education (E-1) on the following topics:   Initial Follow-up  [x]  []  Definition of Gestational Diabetes [x]  []  Why dietary management is important in controlling blood glucose [x]  []  Effects each nutrient has on blood glucose levels [x]  []  Simple carbohydrates vs complex carbohydrates []  []  Fluid intake [x]  []  Creating a balanced meal plan [x]  []  Carbohydrate counting  [x]  []  When to check blood glucose levels [x]  []  Proper blood glucose monitoring techniques [x]  []  Effect of stress and stress reduction techniques  [x]  []  Exercise effect on blood glucose levels, appropriate exercise during pregnancy []  []  Importance of limiting caffeine and abstaining from alcohol and  smoking []  []  Medications used for blood sugar control during pregnancy []  []  Hypoglycemia and rule of 15 []  []  Postpartum self care  Blood glucose monitor given: Prodigy Lot # Meter: [id number] Strips: [id number] B-1 Exp: 10/10/23 CBG: 109 mg/dL  Patient instructed to monitor glucose levels: FBS: 60 - ? 95 mg/dL; 2 hour: ? 127 mg/dL  Patient received handouts: Nutrition Diabetes and Pregnancy Carbohydrate Counting List  Patient will be seen for follow-up as needed.

## 2022-07-31 ENCOUNTER — Encounter: Payer: Self-pay | Admitting: Family Medicine

## 2022-07-31 ENCOUNTER — Other Ambulatory Visit: Payer: Self-pay

## 2022-07-31 ENCOUNTER — Ambulatory Visit (INDEPENDENT_AMBULATORY_CARE_PROVIDER_SITE_OTHER): Payer: Self-pay | Admitting: Family Medicine

## 2022-07-31 VITALS — BP 114/71 | HR 74 | Wt 192.3 lb

## 2022-07-31 DIAGNOSIS — O24419 Gestational diabetes mellitus in pregnancy, unspecified control: Secondary | ICD-10-CM

## 2022-07-31 DIAGNOSIS — Z3A25 25 weeks gestation of pregnancy: Secondary | ICD-10-CM

## 2022-07-31 DIAGNOSIS — Z758 Other problems related to medical facilities and other health care: Secondary | ICD-10-CM

## 2022-07-31 DIAGNOSIS — B3731 Acute candidiasis of vulva and vagina: Secondary | ICD-10-CM

## 2022-07-31 DIAGNOSIS — Z603 Acculturation difficulty: Secondary | ICD-10-CM

## 2022-07-31 DIAGNOSIS — O09522 Supervision of elderly multigravida, second trimester: Secondary | ICD-10-CM

## 2022-07-31 DIAGNOSIS — O0990 Supervision of high risk pregnancy, unspecified, unspecified trimester: Secondary | ICD-10-CM

## 2022-07-31 DIAGNOSIS — O0992 Supervision of high risk pregnancy, unspecified, second trimester: Secondary | ICD-10-CM

## 2022-07-31 MED ORDER — TERCONAZOLE 0.8 % VA CREA: 1.0000 | TOPICAL_CREAM | Freq: Every day | VAGINAL | 0 refills | Status: DC

## 2022-07-31 NOTE — Progress Notes (Signed)
   PRENATAL VISIT NOTE  Subjective:  Regina Castaneda is a 47 y.o. [phone] at [redacted]w[redacted]d being seen today for ongoing prenatal care.  She is currently monitored for the following issues for this high-risk pregnancy and has Supervision of high risk pregnancy, antepartum; AMA (advanced maternal age) multigravida 35+; GDM diagnosed at 38 weeks; and Language barrier on their problem list.  Patient reports no complaints.  Contractions: Not present. Vag. Bleeding: None.  Movement: Present. Denies leaking of fluid.   The following portions of the patient's history were reviewed and updated as appropriate: allergies, current medications, past family history, past medical history, past social history, past surgical history and problem list.   Objective:   Vitals:   07/31/22 1609  BP: 114/71  Pulse: 74  Weight: 192 lb 4.8 oz (87.2 kg)    Fetal Status: Fetal Heart Rate (bpm): 144 Fundal Height: 26 cm Movement: Present     General:  Alert, oriented and cooperative. Patient is in no acute distress.  Skin: Skin is warm and dry. No rash noted.   Cardiovascular: Normal heart rate noted  Respiratory: Normal respiratory effort, no problems with respiration noted  Abdomen: Soft, gravid, appropriate for gestational age.  Pain/Pressure: Absent     Pelvic: Cervical exam deferred        Extremities: Normal range of motion.  Edema: None  Mental Status: Normal mood and affect. Normal behavior. Normal judgment and thought content.   Assessment and Plan:  Pregnancy: G4P3003 at [redacted]w[redacted]d 1. Gestational diabetes mellitus (GDM) in second trimester, gestational diabetes method of control unspecified Just had teaching yesterday--reports CBGs 110 last pm. No log today  2. Supervision of high risk pregnancy, antepartum Continue prenatal care.  3. Language barrier Spanish interpreter: Eda used  4. Multigravida of advanced maternal age in second trimester LR NIPT  5. Vaginal yeast infection - terconazole (TERAZOL 3)  0.8 % vaginal cream; Place 1 applicator vaginally at bedtime.  Dispense: 20 g; Refill: 0  Preterm labor symptoms and general obstetric precautions including but not limited to vaginal bleeding, contractions, leaking of fluid and fetal movement were reviewed in detail with the patient. Please refer to After Visit Summary for other counseling recommendations.   Return in 3 weeks (on 08/21/2022).  Future Appointments  Date Time Provider Department Center  08/06/2022 12:30 PM [facility] NURSE [facility]  08/06/2022 12:45 PM [facility] US4 [facility]  08/21/2022  3:15 PM Crissie Reese, Mary Sella, MD [facility]    Reva Bores, MD

## 2022-08-06 ENCOUNTER — Ambulatory Visit: Payer: Self-pay

## 2022-08-06 ENCOUNTER — Ambulatory Visit: Payer: No Typology Code available for payment source | Attending: Obstetrics | Admitting: *Deleted

## 2022-08-06 ENCOUNTER — Ambulatory Visit (HOSPITAL_BASED_OUTPATIENT_CLINIC_OR_DEPARTMENT_OTHER): Payer: No Typology Code available for payment source

## 2022-08-06 ENCOUNTER — Other Ambulatory Visit: Payer: Self-pay | Admitting: *Deleted

## 2022-08-06 VITALS — BP 122/57 | HR 72

## 2022-08-06 DIAGNOSIS — O24419 Gestational diabetes mellitus in pregnancy, unspecified control: Secondary | ICD-10-CM

## 2022-08-06 DIAGNOSIS — E669 Obesity, unspecified: Secondary | ICD-10-CM

## 2022-08-06 DIAGNOSIS — O283 Abnormal ultrasonic finding on antenatal screening of mother: Secondary | ICD-10-CM

## 2022-08-06 DIAGNOSIS — O99213 Obesity complicating pregnancy, third trimester: Secondary | ICD-10-CM | POA: Insufficient documentation

## 2022-08-06 DIAGNOSIS — O09522 Supervision of elderly multigravida, second trimester: Secondary | ICD-10-CM

## 2022-08-06 DIAGNOSIS — O99212 Obesity complicating pregnancy, second trimester: Secondary | ICD-10-CM

## 2022-08-06 DIAGNOSIS — O09523 Supervision of elderly multigravida, third trimester: Secondary | ICD-10-CM | POA: Insufficient documentation

## 2022-08-06 DIAGNOSIS — O24410 Gestational diabetes mellitus in pregnancy, diet controlled: Secondary | ICD-10-CM

## 2022-08-06 DIAGNOSIS — Z3A26 26 weeks gestation of pregnancy: Secondary | ICD-10-CM

## 2022-08-06 DIAGNOSIS — Z363 Encounter for antenatal screening for malformations: Secondary | ICD-10-CM | POA: Insufficient documentation

## 2022-08-06 DIAGNOSIS — O0990 Supervision of high risk pregnancy, unspecified, unspecified trimester: Secondary | ICD-10-CM

## 2022-08-06 DIAGNOSIS — O35BXX0 Maternal care for other (suspected) fetal abnormality and damage, fetal cardiac anomalies, not applicable or unspecified: Secondary | ICD-10-CM

## 2022-08-21 ENCOUNTER — Ambulatory Visit (INDEPENDENT_AMBULATORY_CARE_PROVIDER_SITE_OTHER): Payer: Self-pay | Admitting: Obstetrics and Gynecology

## 2022-08-21 ENCOUNTER — Other Ambulatory Visit: Payer: Self-pay

## 2022-08-21 ENCOUNTER — Encounter: Payer: Self-pay | Admitting: Family Medicine

## 2022-08-21 VITALS — BP 113/71 | HR 76 | Wt 189.2 lb

## 2022-08-21 DIAGNOSIS — Z603 Acculturation difficulty: Secondary | ICD-10-CM

## 2022-08-21 DIAGNOSIS — Z758 Other problems related to medical facilities and other health care: Secondary | ICD-10-CM

## 2022-08-21 DIAGNOSIS — O0990 Supervision of high risk pregnancy, unspecified, unspecified trimester: Secondary | ICD-10-CM

## 2022-08-21 DIAGNOSIS — O09523 Supervision of elderly multigravida, third trimester: Secondary | ICD-10-CM

## 2022-08-21 DIAGNOSIS — O24419 Gestational diabetes mellitus in pregnancy, unspecified control: Secondary | ICD-10-CM

## 2022-08-21 NOTE — Progress Notes (Signed)
   PRENATAL VISIT NOTE  Subjective:  Regina Castaneda is a 47 y.o. [phone] at [redacted]w[redacted]d being seen today for ongoing prenatal care.  She is currently monitored for the following issues for this high-risk pregnancy and has Supervision of high risk pregnancy, antepartum; AMA (advanced maternal age) multigravida 35+; GDM diagnosed at 80 weeks; and Language barrier on their problem list.  Patient reports no complaints.  Contractions: Not present. Vag. Bleeding: None.  Movement: Present. Denies leaking of fluid.   The following portions of the patient's history were reviewed and updated as appropriate: allergies, current medications, past family history, past medical history, past social history, past surgical history and problem list.   Objective:   Vitals:   08/21/22 1452  BP: 113/71  Pulse: 76  Weight: 189 lb 3.2 oz (85.8 kg)    Fetal Status: Fetal Heart Rate (bpm): 150 Fundal Height: 28 cm Movement: Present     General:  Alert, oriented and cooperative. Patient is in no acute distress.  Skin: Skin is warm and dry. No rash noted.   Cardiovascular: Normal heart rate noted  Respiratory: Normal respiratory effort, no problems with respiration noted  Abdomen: Soft, gravid, appropriate for gestational age.  Pain/Pressure: Present     Pelvic: Cervical exam deferred        Extremities: Normal range of motion.     Mental Status: Normal mood and affect. Normal behavior. Normal judgment and thought content.   Assessment and Plan:  Pregnancy: G4P3003 at 103w4d 1. Supervision of high risk pregnancy, antepartum BP and FHR normal  Feeling vigorous movement  Continue aspirin  2. Gestational diabetes mellitus (GDM) in second trimester, gestational diabetes method of control unspecified Diet controlled, majority within range. Have to work at 4am so FBS are taken around 0300.  Working hard on following diet discussed with diabetic educator   3. Language barrier Spanish interpreter: Eda present for  entire visit  4. Multigravida of advanced maternal age, third trimester U/s 4/17 normal with previously noted EIF, next U/S scheduled for 5/20  Preterm labor symptoms and general obstetric precautions including but not limited to vaginal bleeding, contractions, leaking of fluid and fetal movement were reviewed in detail with the patient.   Future Appointments  Date Time Provider Department Center  09/08/2022 12:30 PM [facility] NURSE [facility]  09/08/2022 12:45 PM [facility] US5 [facility]  10/01/2022 12:30 PM [facility]SE [facility]  10/01/2022 12:45 PM [facility]4 [facility]  10/08/2022 12:30 PM [facility]SE [facility]  10/08/2022 12:45 PM [facility] US5 [facility]  10/15/2022 12:30 PM [facility]URSE [facility]  10/15/2022 12:45 PM [facility] US5 [facility]  10/22/2022 12:30 PM [facility]SE [facility]  10/22/2022 12:45 PM [facility] US5 [facility]  10/29/2022 12:30 PM [facility]URSE [facility]  10/29/2022 12:45 PM [facility]4 [facility]    Albertine Grates, FNP

## 2022-08-22 LAB — RPR: RPR Ser Ql: NONREACTIVE

## 2022-08-22 LAB — CBC
Hematocrit: 32 % — ABNORMAL LOW (ref 34.0–46.6)
Hemoglobin: 10.5 g/dL — ABNORMAL LOW (ref 11.1–15.9)
MCH: 29.7 pg (ref 26.6–33.0)
MCHC: 32.8 g/dL (ref 31.5–35.7)
MCV: 90 fL (ref 79–97)
Platelets: 227 10*3/uL (ref 150–450)
RBC: 3.54 x10E6/uL — ABNORMAL LOW (ref 3.77–5.28)
RDW: 13.3 % (ref 11.7–15.4)
WBC: 7.9 10*3/uL (ref 3.4–10.8)

## 2022-08-22 LAB — HIV ANTIBODY (ROUTINE TESTING W REFLEX): HIV Screen 4th Generation wRfx: NONREACTIVE

## 2022-09-08 ENCOUNTER — Ambulatory Visit: Payer: No Typology Code available for payment source | Attending: Maternal & Fetal Medicine

## 2022-09-08 ENCOUNTER — Ambulatory Visit: Payer: No Typology Code available for payment source

## 2022-09-08 VITALS — BP 114/61 | HR 72

## 2022-09-08 DIAGNOSIS — O09523 Supervision of elderly multigravida, third trimester: Secondary | ICD-10-CM | POA: Insufficient documentation

## 2022-09-08 DIAGNOSIS — O99212 Obesity complicating pregnancy, second trimester: Secondary | ICD-10-CM

## 2022-09-08 DIAGNOSIS — O09522 Supervision of elderly multigravida, second trimester: Secondary | ICD-10-CM

## 2022-09-08 DIAGNOSIS — E669 Obesity, unspecified: Secondary | ICD-10-CM

## 2022-09-08 DIAGNOSIS — O24419 Gestational diabetes mellitus in pregnancy, unspecified control: Secondary | ICD-10-CM

## 2022-09-08 DIAGNOSIS — O0990 Supervision of high risk pregnancy, unspecified, unspecified trimester: Secondary | ICD-10-CM

## 2022-09-08 DIAGNOSIS — O24410 Gestational diabetes mellitus in pregnancy, diet controlled: Secondary | ICD-10-CM

## 2022-09-08 DIAGNOSIS — Z3A31 31 weeks gestation of pregnancy: Secondary | ICD-10-CM

## 2022-09-08 DIAGNOSIS — O283 Abnormal ultrasonic finding on antenatal screening of mother: Secondary | ICD-10-CM

## 2022-09-08 DIAGNOSIS — O35BXX3 Maternal care for other (suspected) fetal abnormality and damage, fetal cardiac anomalies, fetus 3: Secondary | ICD-10-CM

## 2022-09-18 ENCOUNTER — Ambulatory Visit (INDEPENDENT_AMBULATORY_CARE_PROVIDER_SITE_OTHER): Payer: Self-pay | Admitting: Obstetrics and Gynecology

## 2022-09-18 ENCOUNTER — Other Ambulatory Visit: Payer: Self-pay

## 2022-09-18 VITALS — BP 105/67 | HR 64 | Wt 191.5 lb

## 2022-09-18 DIAGNOSIS — O09523 Supervision of elderly multigravida, third trimester: Secondary | ICD-10-CM

## 2022-09-18 DIAGNOSIS — Z758 Other problems related to medical facilities and other health care: Secondary | ICD-10-CM

## 2022-09-18 DIAGNOSIS — O24419 Gestational diabetes mellitus in pregnancy, unspecified control: Secondary | ICD-10-CM

## 2022-09-18 DIAGNOSIS — Z603 Acculturation difficulty: Secondary | ICD-10-CM

## 2022-09-18 DIAGNOSIS — Z3A32 32 weeks gestation of pregnancy: Secondary | ICD-10-CM

## 2022-09-18 MED ORDER — FERROUS SULFATE 324 MG PO TBEC: 324.0000 mg | DELAYED_RELEASE_TABLET | Freq: Every day | ORAL | 0 refills | Status: AC

## 2022-09-18 NOTE — Progress Notes (Signed)
   PRENATAL VISIT NOTE  Subjective:  Regina Castaneda is a 47 y.o. [phone] at [redacted]w[redacted]d being seen today for ongoing prenatal care.  She is currently monitored for the following issues for this high-risk pregnancy and has Supervision of high risk pregnancy, antepartum; AMA (advanced maternal age) multigravida 35+; GDM diagnosed at 89 weeks; and Language barrier on their problem list.  Patient reports no complaints.  Contractions: Not present. Vag. Bleeding: None.  Movement: Present. Denies leaking of fluid.   The following portions of the patient's history were reviewed and updated as appropriate: allergies, current medications, past family history, past medical history, past social history, past surgical history and problem list.   Objective:   Vitals:   09/18/22 1143  BP: 105/67  Pulse: 64  Weight: 191 lb 8 oz (86.9 kg)    Fetal Status: Fetal Heart Rate (bpm): 148 Fundal Height: 32 cm Movement: Present     General:  Alert, oriented and cooperative. Patient is in no acute distress.  Skin: Skin is warm and dry. No rash noted.   Cardiovascular: Normal heart rate noted  Respiratory: Normal respiratory effort, no problems with respiration noted  Abdomen: Soft, gravid, appropriate for gestational age.  Pain/Pressure: Absent     Pelvic: Cervical exam deferred        Extremities: Normal range of motion.  Edema: None  Mental Status: Normal mood and affect. Normal behavior. Normal judgment and thought content.   Assessment and Plan:  Pregnancy: G4P3003 at [redacted]w[redacted]d 1. [redacted] weeks gestation of pregnancy D/w pt re: iron, which I sent in D/w pt more next visit re: birth control.   2. Multigravida of advanced maternal age in third trimester See below  3. Gestational diabetes mellitus (GDM) in third trimester, gestational diabetes method of control unspecified A few slightly above range AM fasting and 2h pp dinner but overall looks good. Continue with just A1 control.  Pt to start weekly testing in  two weeks 5/20: 66%, 1873g, ac 87%, afi 17.   4. Language barrier In person interpeter used  Preterm labor symptoms and general obstetric precautions including but not limited to vaginal bleeding, contractions, leaking of fluid and fetal movement were reviewed in detail with the patient. Please refer to After Visit Summary for other counseling recommendations.   Return in about 13 days (around 10/01/2022) for in person, high risk ob, md visit.  Future Appointments  Date Time Provider Department Center  10/01/2022 12:30 PM [facility]URSE [facility]  10/01/2022 12:45 PM [facility] US4 [facility]  10/02/2022  1:15 PM Anderson Bing, MD [facility]  10/08/2022 12:30 PM [facility]URSE [facility]  10/08/2022 12:45 PM [facility] US5 [facility]  10/08/2022  1:55 PM Warden Fillers, MD [facility]  10/15/2022 12:30 PM [facility]URSE [facility]  10/15/2022 12:45 PM [facility] US5 [facility]  10/15/2022  2:35 PM [place] Bing, MD [facility]  10/22/2022 12:30 PM [facility] NURSE [facility]  10/22/2022 12:45 PM [facility]5 [facility]  10/29/2022 12:30 PM [facility] NURSE [facility]  10/29/2022 12:45 PM [facility]4 [facility]    [place] Bing, MD

## 2022-10-01 ENCOUNTER — Ambulatory Visit: Payer: Self-pay | Attending: Maternal & Fetal Medicine

## 2022-10-01 ENCOUNTER — Ambulatory Visit: Payer: Self-pay | Admitting: *Deleted

## 2022-10-01 VITALS — BP 112/57 | HR 67

## 2022-10-01 DIAGNOSIS — O35BXX0 Maternal care for other (suspected) fetal abnormality and damage, fetal cardiac anomalies, not applicable or unspecified: Secondary | ICD-10-CM

## 2022-10-01 DIAGNOSIS — O24419 Gestational diabetes mellitus in pregnancy, unspecified control: Secondary | ICD-10-CM

## 2022-10-01 DIAGNOSIS — O283 Abnormal ultrasonic finding on antenatal screening of mother: Secondary | ICD-10-CM | POA: Insufficient documentation

## 2022-10-01 DIAGNOSIS — Z3A34 34 weeks gestation of pregnancy: Secondary | ICD-10-CM

## 2022-10-01 DIAGNOSIS — O24410 Gestational diabetes mellitus in pregnancy, diet controlled: Secondary | ICD-10-CM

## 2022-10-01 DIAGNOSIS — O0990 Supervision of high risk pregnancy, unspecified, unspecified trimester: Secondary | ICD-10-CM

## 2022-10-01 DIAGNOSIS — O99212 Obesity complicating pregnancy, second trimester: Secondary | ICD-10-CM | POA: Insufficient documentation

## 2022-10-01 DIAGNOSIS — O09523 Supervision of elderly multigravida, third trimester: Secondary | ICD-10-CM

## 2022-10-01 DIAGNOSIS — E669 Obesity, unspecified: Secondary | ICD-10-CM

## 2022-10-01 DIAGNOSIS — O99213 Obesity complicating pregnancy, third trimester: Secondary | ICD-10-CM

## 2022-10-01 DIAGNOSIS — O09522 Supervision of elderly multigravida, second trimester: Secondary | ICD-10-CM | POA: Insufficient documentation

## 2022-10-02 ENCOUNTER — Other Ambulatory Visit: Payer: Self-pay

## 2022-10-02 ENCOUNTER — Ambulatory Visit (INDEPENDENT_AMBULATORY_CARE_PROVIDER_SITE_OTHER): Payer: Self-pay | Admitting: Obstetrics and Gynecology

## 2022-10-02 VITALS — BP 118/68 | HR 76 | Wt 189.5 lb

## 2022-10-02 DIAGNOSIS — Z3A34 34 weeks gestation of pregnancy: Secondary | ICD-10-CM

## 2022-10-02 DIAGNOSIS — O0990 Supervision of high risk pregnancy, unspecified, unspecified trimester: Secondary | ICD-10-CM

## 2022-10-02 DIAGNOSIS — Z603 Acculturation difficulty: Secondary | ICD-10-CM

## 2022-10-02 DIAGNOSIS — Z758 Other problems related to medical facilities and other health care: Secondary | ICD-10-CM

## 2022-10-02 DIAGNOSIS — O0993 Supervision of high risk pregnancy, unspecified, third trimester: Secondary | ICD-10-CM

## 2022-10-02 DIAGNOSIS — O09523 Supervision of elderly multigravida, third trimester: Secondary | ICD-10-CM

## 2022-10-02 DIAGNOSIS — D170 Benign lipomatous neoplasm of skin and subcutaneous tissue of head, face and neck: Secondary | ICD-10-CM | POA: Insufficient documentation

## 2022-10-02 DIAGNOSIS — R221 Localized swelling, mass and lump, neck: Secondary | ICD-10-CM

## 2022-10-02 DIAGNOSIS — O24410 Gestational diabetes mellitus in pregnancy, diet controlled: Secondary | ICD-10-CM

## 2022-10-02 NOTE — Progress Notes (Signed)
   PRENATAL VISIT NOTE  Subjective:  Regina Castaneda is a 47 y.o. [phone] at [redacted]w[redacted]d being seen today for ongoing prenatal care.  She is currently monitored for the following issues for this high-risk pregnancy and has Supervision of high risk pregnancy, antepartum; AMA (advanced maternal age) multigravida 35+; GDM diagnosed at 89 weeks; Language barrier; and Lipoma of neck on their problem list.  Patient reports  posterior neck mass feels bigger .  Contractions: Not present. Vag. Bleeding: None.  Movement: Present. Denies leaking of fluid.   The following portions of the patient's history were reviewed and updated as appropriate: allergies, current medications, past family history, past medical history, past social history, past surgical history and problem list.   Objective:   Vitals:   10/02/22 1328  BP: 118/68  Pulse: 76  Weight: 189 lb 8 oz (86 kg)    Fetal Status: Fetal Heart Rate (bpm): 143   Movement: Present     General:  Alert, oriented and cooperative. Patient is in no acute distress.  Skin: Skin is warm and dry. No rash noted.   Cardiovascular: Normal heart rate noted  Respiratory: Normal respiratory effort, no problems with respiration noted  Abdomen: Soft, gravid, appropriate for gestational age.  Pain/Pressure: Present     Pelvic: Cervical exam performed in the presence of a chaperone        Extremities: Normal range of motion.  Edema: None  Mental Status: Normal mood and affect. Normal behavior. Normal judgment and thought content.   Neck a few cm to the left of the mid line approximately 3-4cm above the scapula is a 6x6cm, mobile, nttp mass that may be tightly fluid filled. Normal overlying skin  Assessment and Plan:  Pregnancy: G4P3003 at [redacted]w[redacted]d 1. ?Lipoma of neck Patient states she's had it for years but feels bigger and sometimes uncomfortable. Referral placed.  - Ambulatory referral to General Surgery  2. Diet controlled gestational diabetes mellitus (GDM) in  third trimester Normal cbg log  3. Multigravida of advanced maternal age in third trimester Normal u/s yesterday. Continue weekly testing. D/w her re: setting up 39wk IOL next visit 6/12: ceph, 31%, 2316g, ac 50%, afi 14, 8/8  4. Language barrier In person interpreter used  5. Supervision of high risk pregnancy, antepartum - Ambulatory referral to General Surgery  6. [redacted] weeks gestation of pregnancy GBS next visit - Ambulatory referral to General Surgery  Preterm labor symptoms and general obstetric precautions including but not limited to vaginal bleeding, contractions, leaking of fluid and fetal movement were reviewed in detail with the patient. Please refer to After Visit Summary for other counseling recommendations.   No follow-ups on file.  Future Appointments  Date Time Provider Department Center  10/08/2022 12:30 PM [facility] NURSE [facility]  10/08/2022 12:45 PM [facility] US5 [facility]  10/08/2022  1:55 PM Warden Fillers, MD [facility]  10/15/2022 12:30 PM [facility] NURSE [facility]  10/15/2022 12:45 PM [facility] US5 [facility]  10/15/2022  2:35 PM [place] Bing, MD [facility]  10/22/2022 12:30 PM [facility]URSE [facility]  10/22/2022 12:45 PM [facility]5 [facility]  10/29/2022 12:30 PM [facility] NURSE [facility]  10/29/2022 12:45 PM [facility]4 [facility]     Bing, MD

## 2022-10-08 ENCOUNTER — Ambulatory Visit: Payer: Self-pay | Attending: Maternal & Fetal Medicine

## 2022-10-08 ENCOUNTER — Ambulatory Visit (INDEPENDENT_AMBULATORY_CARE_PROVIDER_SITE_OTHER): Payer: Self-pay | Admitting: Obstetrics and Gynecology

## 2022-10-08 ENCOUNTER — Other Ambulatory Visit: Payer: Self-pay

## 2022-10-08 ENCOUNTER — Ambulatory Visit: Payer: Self-pay | Admitting: *Deleted

## 2022-10-08 VITALS — BP 121/57 | HR 71

## 2022-10-08 VITALS — BP 103/56 | HR 64 | Wt 185.6 lb

## 2022-10-08 DIAGNOSIS — O99212 Obesity complicating pregnancy, second trimester: Secondary | ICD-10-CM | POA: Insufficient documentation

## 2022-10-08 DIAGNOSIS — O99213 Obesity complicating pregnancy, third trimester: Secondary | ICD-10-CM

## 2022-10-08 DIAGNOSIS — O09523 Supervision of elderly multigravida, third trimester: Secondary | ICD-10-CM

## 2022-10-08 DIAGNOSIS — E669 Obesity, unspecified: Secondary | ICD-10-CM

## 2022-10-08 DIAGNOSIS — Z603 Acculturation difficulty: Secondary | ICD-10-CM

## 2022-10-08 DIAGNOSIS — O24410 Gestational diabetes mellitus in pregnancy, diet controlled: Secondary | ICD-10-CM | POA: Insufficient documentation

## 2022-10-08 DIAGNOSIS — O09522 Supervision of elderly multigravida, second trimester: Secondary | ICD-10-CM | POA: Insufficient documentation

## 2022-10-08 DIAGNOSIS — Z3A35 35 weeks gestation of pregnancy: Secondary | ICD-10-CM

## 2022-10-08 DIAGNOSIS — O35BXX0 Maternal care for other (suspected) fetal abnormality and damage, fetal cardiac anomalies, not applicable or unspecified: Secondary | ICD-10-CM

## 2022-10-08 DIAGNOSIS — O0990 Supervision of high risk pregnancy, unspecified, unspecified trimester: Secondary | ICD-10-CM

## 2022-10-08 DIAGNOSIS — O24419 Gestational diabetes mellitus in pregnancy, unspecified control: Secondary | ICD-10-CM | POA: Insufficient documentation

## 2022-10-08 DIAGNOSIS — Z758 Other problems related to medical facilities and other health care: Secondary | ICD-10-CM

## 2022-10-08 DIAGNOSIS — O283 Abnormal ultrasonic finding on antenatal screening of mother: Secondary | ICD-10-CM | POA: Insufficient documentation

## 2022-10-08 NOTE — Progress Notes (Signed)
   PRENATAL VISIT NOTE  Subjective:  Regina Castaneda is a 47 y.o. [phone] at [redacted]w[redacted]d being seen today for ongoing prenatal care.  She is currently monitored for the following issues for this high-risk pregnancy and has Supervision of high risk pregnancy, antepartum; AMA (advanced maternal age) multigravida 35+; GDM diagnosed at 72 weeks; Language barrier; and Lipoma of neck on their problem list.  Patient doing well with no acute concerns today. She reports no complaints.  Contractions: Regular.  .  Movement: Present. Denies leaking of fluid.   The following portions of the patient's history were reviewed and updated as appropriate: allergies, current medications, past family history, past medical history, past social history, past surgical history and problem list. Problem list updated.  Objective:   Vitals:   10/08/22 1340  BP: (!) 103/56  Pulse: 64  Weight: 185 lb 9.6 oz (84.2 kg)    Fetal Status: Fetal Heart Rate (bpm): 156 Fundal Height: 37 cm Movement: Present     General:  Alert, oriented and cooperative. Patient is in no acute distress.  Skin: Skin is warm and dry. No rash noted.   Cardiovascular: Normal heart rate noted  Respiratory: Normal respiratory effort, no problems with respiration noted  Abdomen: Soft, gravid, appropriate for gestational age.  Pain/Pressure: Absent     Pelvic: Cervical exam deferred        Extremities: Normal range of motion.  Edema: None  Mental Status:  Normal mood and affect. Normal behavior. Normal judgment and thought content.   Assessment and Plan:  Pregnancy: G4P3003 at [redacted]w[redacted]d  1. [redacted] weeks gestation of pregnancy   2. Diet controlled gestational diabetes mellitus (GDM) in third trimester FBS: 81-93 PPBS: 78-116  Fetal testing WNL. 8/8 Excellent blood sugar control  3. Supervision of high risk pregnancy, antepartum Continue routine prenatal care Schedule IOL at next visit for 39 weeks due to [facility] and diet controlled GDM  4. Language  barrier Live interpreter present  5. Multigravida of advanced maternal age in third trimester   Preterm labor symptoms and general obstetric precautions including but not limited to vaginal bleeding, contractions, leaking of fluid and fetal movement were reviewed in detail with the patient.  Please refer to After Visit Summary for other counseling recommendations.   Return in about 1 week (around 10/15/2022) for [facility], in person, 36 weeks swabs.   Mariel Aloe, MD Faculty Attending Center for [facility]

## 2022-10-15 ENCOUNTER — Ambulatory Visit: Payer: Self-pay | Admitting: *Deleted

## 2022-10-15 ENCOUNTER — Ambulatory Visit (INDEPENDENT_AMBULATORY_CARE_PROVIDER_SITE_OTHER): Payer: Self-pay | Admitting: Obstetrics and Gynecology

## 2022-10-15 ENCOUNTER — Ambulatory Visit: Payer: Self-pay | Attending: Maternal & Fetal Medicine

## 2022-10-15 ENCOUNTER — Other Ambulatory Visit (HOSPITAL_COMMUNITY)
Admission: RE | Admit: 2022-10-15 | Discharge: 2022-10-15 | Disposition: A | Discharge status: Home / Self Care | Payer: Self-pay | Source: Ambulatory Visit | Attending: Obstetrics and Gynecology | Admitting: Obstetrics and Gynecology

## 2022-10-15 VITALS — BP 116/61 | HR 67

## 2022-10-15 VITALS — BP 109/71 | HR 67 | Wt 192.5 lb

## 2022-10-15 DIAGNOSIS — Z758 Other problems related to medical facilities and other health care: Secondary | ICD-10-CM

## 2022-10-15 DIAGNOSIS — D170 Benign lipomatous neoplasm of skin and subcutaneous tissue of head, face and neck: Secondary | ICD-10-CM

## 2022-10-15 DIAGNOSIS — O283 Abnormal ultrasonic finding on antenatal screening of mother: Secondary | ICD-10-CM | POA: Insufficient documentation

## 2022-10-15 DIAGNOSIS — O0990 Supervision of high risk pregnancy, unspecified, unspecified trimester: Secondary | ICD-10-CM | POA: Insufficient documentation

## 2022-10-15 DIAGNOSIS — R7871 Abnormal lead level in blood: Secondary | ICD-10-CM

## 2022-10-15 DIAGNOSIS — B3731 Acute candidiasis of vulva and vagina: Secondary | ICD-10-CM

## 2022-10-15 DIAGNOSIS — O09523 Supervision of elderly multigravida, third trimester: Secondary | ICD-10-CM | POA: Insufficient documentation

## 2022-10-15 DIAGNOSIS — O24410 Gestational diabetes mellitus in pregnancy, diet controlled: Secondary | ICD-10-CM | POA: Insufficient documentation

## 2022-10-15 DIAGNOSIS — O99213 Obesity complicating pregnancy, third trimester: Secondary | ICD-10-CM

## 2022-10-15 DIAGNOSIS — O24419 Gestational diabetes mellitus in pregnancy, unspecified control: Secondary | ICD-10-CM | POA: Insufficient documentation

## 2022-10-15 DIAGNOSIS — O35BXX0 Maternal care for other (suspected) fetal abnormality and damage, fetal cardiac anomalies, not applicable or unspecified: Secondary | ICD-10-CM

## 2022-10-15 DIAGNOSIS — Z3A36 36 weeks gestation of pregnancy: Secondary | ICD-10-CM

## 2022-10-15 DIAGNOSIS — O09522 Supervision of elderly multigravida, second trimester: Secondary | ICD-10-CM | POA: Insufficient documentation

## 2022-10-15 DIAGNOSIS — E669 Obesity, unspecified: Secondary | ICD-10-CM

## 2022-10-15 DIAGNOSIS — O99212 Obesity complicating pregnancy, second trimester: Secondary | ICD-10-CM | POA: Insufficient documentation

## 2022-10-15 DIAGNOSIS — Z603 Acculturation difficulty: Secondary | ICD-10-CM

## 2022-10-15 MED ORDER — MICONAZOLE NITRATE 2 % VA CREA: 1.0000 | TOPICAL_CREAM | Freq: Every day | VAGINAL | 2 refills | Status: DC

## 2022-10-15 NOTE — Progress Notes (Addendum)
PRENATAL VISIT NOTE  Subjective:  Regina Castaneda is a 47 y.o. [phone] at [redacted]w[redacted]d being seen today for ongoing prenatal care.  She is currently monitored for the following issues for this high-risk pregnancy and has Supervision of high risk pregnancy, antepartum; AMA (advanced maternal age) multigravida 35+; GDM diagnosed at 31 weeks; Language barrier; and Lipoma of neck on their problem list.  Patient reports no complaints.  Contractions: Not present. Vag. Bleeding: None.  Movement: Present. Denies leaking of fluid.   The following portions of the patient's history were reviewed and updated as appropriate: allergies, current medications, past family history, past medical history, past social history, past surgical history and problem list.   Objective:   Vitals:   10/15/22 1401  BP: 109/71  Pulse: 67  Weight: 192 lb 8 oz (87.3 kg)    Fetal Status: Fetal Heart Rate (bpm): 130   Movement: Present  Presentation: Vertex  General:  Alert, oriented and cooperative. Patient is in no acute distress.  Skin: Skin is warm and dry. No rash noted.   Cardiovascular: Normal heart rate noted  Respiratory: Normal respiratory effort, no problems with respiration noted  Abdomen: Soft, gravid, appropriate for gestational age.  Pain/Pressure: Absent     Pelvic: Cervical exam performed in the presence of a chaperone Dilation: 1 Effacement (%): 50 Station: Ballotable  Extremities: Normal range of motion.     Mental Status: Normal mood and affect. Normal behavior. Normal judgment and thought content.   Assessment and Plan:  Pregnancy: G4P3003 at [redacted]w[redacted]d 1. Supervision of high risk pregnancy, antepartum Routine care. Add GBS abx prn  - Cervicovaginal ancillary only( [place]) - Culture, beta strep (group b only)  2. [redacted] weeks gestation of pregnancy  3. Diet controlled gestational diabetes mellitus (GDM) in third trimester Normal CBG log. Bpp 8/8 today, cephalic, afi 18. Continue qwk testing Pt  amenable for 39wk IOL and was set up for 7/13 at 2345 IOL 6/12: 31%, 2316gm, ac 50%, afi 14  4. Multigravida of advanced maternal age in third trimester LR panorama  5. Vulvovaginal candidiasis Monistat 7  6. Lipoma of neck Has gen surg consult on 7/9 confirmed with patient  7. Language barrier In person interpreter used  Preterm labor symptoms and general obstetric precautions including but not limited to vaginal bleeding, contractions, leaking of fluid and fetal movement were reviewed in detail with the patient. Please refer to After Visit Summary for other counseling recommendations.   Return in about 1 week (around 10/22/2022) for in person, high risk ob, md or app.  Future Appointments  Date Time Provider Department Center  10/15/2022  2:35 PM [place] Bing, MD [facility]  10/22/2022 12:30 PM [facility] NURSE [facility]  10/22/2022 12:45 PM [facility] US5 [facility]  10/28/2022  3:15 PM Reva Bores, MD [facility]  10/29/2022 12:30 PM [facility] NURSE [facility]  10/29/2022 12:45 PM [facility] US4 [facility]  11/02/2022 12:00 AM [facility] SCHED ROOM [facility] None    [place] Bing, MD   **ADDENDUM** After today's visit, the HD voicemail on the nurse line asked if we were following up on her elevated blood levels at her new OB visit at [facility]. In records review, she had 06/05/22 level of 7.39.  HD called and we told them on voicemail that her levels were not being drawn here. Patient has already left and can see next week if HD contacted her for repeat level and if not, have it drawn here [place] Bing, Montez Hageman  MD Attending Center for [employer] (Faculty Practice) 10/15/2022 Time: [phone]

## 2022-10-15 NOTE — Addendum Note (Signed)
Addended by: Guy Begin on: 10/15/2022 05:09 PM   Modules accepted: Orders

## 2022-10-15 NOTE — Progress Notes (Signed)
Patient complains of vulvar itch. She informed me that she was once prescribed cream Monistat in the past and it worked. Would like a refill on that Rx.

## 2022-10-15 NOTE — Progress Notes (Signed)
error 

## 2022-10-15 NOTE — Addendum Note (Signed)
Addended by: Benjaman Artman O on: 10/15/2022 05:09 PM   Modules accepted: Orders  

## 2022-10-16 LAB — CERVICOVAGINAL ANCILLARY ONLY
Chlamydia: NEGATIVE
Comment: NEGATIVE
Comment: NORMAL
Neisseria Gonorrhea: NEGATIVE

## 2022-10-19 LAB — CULTURE, BETA STREP (GROUP B ONLY): Strep Gp B Culture: NEGATIVE

## 2022-10-20 ENCOUNTER — Telehealth (HOSPITAL_COMMUNITY): Payer: Self-pay | Admitting: *Deleted

## 2022-10-20 ENCOUNTER — Encounter (HOSPITAL_COMMUNITY): Payer: Self-pay

## 2022-10-20 NOTE — Telephone Encounter (Signed)
Preadmission screen  

## 2022-10-21 ENCOUNTER — Telehealth (HOSPITAL_COMMUNITY): Payer: Self-pay | Admitting: *Deleted

## 2022-10-21 NOTE — Telephone Encounter (Signed)
Interpreter number [phone]

## 2022-10-22 ENCOUNTER — Encounter (HOSPITAL_COMMUNITY): Payer: Self-pay | Admitting: *Deleted

## 2022-10-22 ENCOUNTER — Ambulatory Visit: Payer: Self-pay | Attending: Maternal & Fetal Medicine

## 2022-10-22 ENCOUNTER — Ambulatory Visit: Payer: Self-pay | Admitting: *Deleted

## 2022-10-22 ENCOUNTER — Telehealth (HOSPITAL_COMMUNITY): Payer: Self-pay | Admitting: *Deleted

## 2022-10-22 VITALS — BP 131/64 | HR 70

## 2022-10-22 DIAGNOSIS — O09523 Supervision of elderly multigravida, third trimester: Secondary | ICD-10-CM

## 2022-10-22 DIAGNOSIS — O24410 Gestational diabetes mellitus in pregnancy, diet controlled: Secondary | ICD-10-CM

## 2022-10-22 DIAGNOSIS — O24419 Gestational diabetes mellitus in pregnancy, unspecified control: Secondary | ICD-10-CM | POA: Diagnosis present

## 2022-10-22 DIAGNOSIS — O283 Abnormal ultrasonic finding on antenatal screening of mother: Secondary | ICD-10-CM | POA: Diagnosis present

## 2022-10-22 DIAGNOSIS — O99212 Obesity complicating pregnancy, second trimester: Secondary | ICD-10-CM | POA: Insufficient documentation

## 2022-10-22 DIAGNOSIS — O09522 Supervision of elderly multigravida, second trimester: Secondary | ICD-10-CM | POA: Diagnosis present

## 2022-10-22 DIAGNOSIS — E669 Obesity, unspecified: Secondary | ICD-10-CM

## 2022-10-22 DIAGNOSIS — O0990 Supervision of high risk pregnancy, unspecified, unspecified trimester: Secondary | ICD-10-CM | POA: Insufficient documentation

## 2022-10-22 DIAGNOSIS — Z3A37 37 weeks gestation of pregnancy: Secondary | ICD-10-CM

## 2022-10-22 DIAGNOSIS — O99213 Obesity complicating pregnancy, third trimester: Secondary | ICD-10-CM

## 2022-10-22 DIAGNOSIS — O35BXX0 Maternal care for other (suspected) fetal abnormality and damage, fetal cardiac anomalies, not applicable or unspecified: Secondary | ICD-10-CM

## 2022-10-22 NOTE — Telephone Encounter (Signed)
Preadmission screen interpreter number [phone]

## 2022-10-28 ENCOUNTER — Ambulatory Visit (INDEPENDENT_AMBULATORY_CARE_PROVIDER_SITE_OTHER): Payer: Self-pay | Admitting: Family Medicine

## 2022-10-28 ENCOUNTER — Other Ambulatory Visit: Payer: Self-pay

## 2022-10-28 VITALS — BP 107/69 | HR 71 | Wt 199.0 lb

## 2022-10-28 DIAGNOSIS — O09523 Supervision of elderly multigravida, third trimester: Secondary | ICD-10-CM

## 2022-10-28 DIAGNOSIS — Z603 Acculturation difficulty: Secondary | ICD-10-CM

## 2022-10-28 DIAGNOSIS — O24410 Gestational diabetes mellitus in pregnancy, diet controlled: Secondary | ICD-10-CM

## 2022-10-28 DIAGNOSIS — Z758 Other problems related to medical facilities and other health care: Secondary | ICD-10-CM

## 2022-10-28 DIAGNOSIS — O0990 Supervision of high risk pregnancy, unspecified, unspecified trimester: Secondary | ICD-10-CM

## 2022-10-28 DIAGNOSIS — Z3A38 38 weeks gestation of pregnancy: Secondary | ICD-10-CM

## 2022-10-28 NOTE — Progress Notes (Signed)
   PRENATAL VISIT NOTE  Subjective:  Regina Castaneda is a 47 y.o. [phone] at [redacted]w[redacted]d being seen today for ongoing prenatal care.  She is currently monitored for the following issues for this high-risk pregnancy and has Supervision of high risk pregnancy, antepartum; AMA (advanced maternal age) multigravida 35+; GDM diagnosed at 54 weeks; Language barrier; Lipoma of neck; and Elevated blood lead level on their problem list.  Patient reports no complaints.  Contractions: Not present. Vag. Bleeding: None.  Movement: Present. Denies leaking of fluid.   The following portions of the patient's history were reviewed and updated as appropriate: allergies, current medications, past family history, past medical history, past social history, past surgical history and problem list.   Objective:   Vitals:   10/28/22 1600  BP: 107/69  Pulse: 71  Weight: 199 lb (90.3 kg)    Fetal Status: Fetal Heart Rate (bpm): 156 Fundal Height: 38 cm Movement: Present  Presentation: Vertex  General:  Alert, oriented and cooperative. Patient is in no acute distress.  Skin: Skin is warm and dry. No rash noted.   Cardiovascular: Normal heart rate noted  Respiratory: Normal respiratory effort, no problems with respiration noted  Abdomen: Soft, gravid, appropriate for gestational age.  Pain/Pressure: Present     Pelvic: Cervical exam deferred        Extremities: Normal range of motion.     Mental Status: Normal mood and affect. Normal behavior. Normal judgment and thought content.   Assessment and Plan:  Pregnancy: G4P3003 at [redacted]w[redacted]d 1. Supervision of high risk pregnancy, antepartum Continue prenatal care.  2. Multigravida of advanced maternal age in third trimester LR NIPT  3. Language barrier Spanish interpreter: Claudia used  4. Diet controlled gestational diabetes mellitus (GDM) in third trimester  Continue diet   Term labor symptoms and general obstetric precautions including but not limited to vaginal  bleeding, contractions, leaking of fluid and fetal movement were reviewed in detail with the patient. Please refer to After Visit Summary for other counseling recommendations.   Return in 1 week (on 11/04/2022).  Future Appointments  Date Time Provider Department Center  10/29/2022 12:30 PM [facility] NURSE [facility]  10/29/2022 12:45 PM [facility] US4 [facility]  11/02/2022 12:00 AM [facility]-LD SCHED ROOM [facility]IN[facility] None    Reva Bores, MD

## 2022-10-29 ENCOUNTER — Other Ambulatory Visit: Payer: Self-pay | Admitting: Advanced Practice Midwife

## 2022-10-29 ENCOUNTER — Ambulatory Visit: Payer: Self-pay | Attending: Maternal & Fetal Medicine

## 2022-10-29 ENCOUNTER — Ambulatory Visit: Payer: Self-pay | Admitting: *Deleted

## 2022-10-29 VITALS — BP 115/58 | HR 73

## 2022-10-29 DIAGNOSIS — O0990 Supervision of high risk pregnancy, unspecified, unspecified trimester: Secondary | ICD-10-CM

## 2022-10-29 DIAGNOSIS — O99212 Obesity complicating pregnancy, second trimester: Secondary | ICD-10-CM | POA: Insufficient documentation

## 2022-10-29 DIAGNOSIS — O99213 Obesity complicating pregnancy, third trimester: Secondary | ICD-10-CM

## 2022-10-29 DIAGNOSIS — O09523 Supervision of elderly multigravida, third trimester: Secondary | ICD-10-CM

## 2022-10-29 DIAGNOSIS — E669 Obesity, unspecified: Secondary | ICD-10-CM

## 2022-10-29 DIAGNOSIS — O24410 Gestational diabetes mellitus in pregnancy, diet controlled: Secondary | ICD-10-CM

## 2022-10-29 DIAGNOSIS — O24419 Gestational diabetes mellitus in pregnancy, unspecified control: Secondary | ICD-10-CM | POA: Diagnosis present

## 2022-10-29 DIAGNOSIS — O09522 Supervision of elderly multigravida, second trimester: Secondary | ICD-10-CM | POA: Insufficient documentation

## 2022-10-29 DIAGNOSIS — O35BXX0 Maternal care for other (suspected) fetal abnormality and damage, fetal cardiac anomalies, not applicable or unspecified: Secondary | ICD-10-CM

## 2022-10-29 DIAGNOSIS — Z3A38 38 weeks gestation of pregnancy: Secondary | ICD-10-CM

## 2022-10-29 DIAGNOSIS — O283 Abnormal ultrasonic finding on antenatal screening of mother: Secondary | ICD-10-CM | POA: Insufficient documentation

## 2022-11-02 ENCOUNTER — Other Ambulatory Visit: Payer: Self-pay

## 2022-11-02 ENCOUNTER — Inpatient Hospital Stay (HOSPITAL_COMMUNITY): Payer: Medicaid Other | Admitting: Anesthesiology

## 2022-11-02 ENCOUNTER — Encounter (HOSPITAL_COMMUNITY): Payer: Self-pay | Admitting: Obstetrics and Gynecology

## 2022-11-02 ENCOUNTER — Inpatient Hospital Stay (HOSPITAL_COMMUNITY)
Admission: RE | Admit: 2022-11-02 | Discharge: 2022-11-03 | DRG: 807 | Disposition: A | Discharge status: Home / Self Care | Payer: Medicaid Other | Attending: Obstetrics & Gynecology | Admitting: Obstetrics & Gynecology

## 2022-11-02 ENCOUNTER — Inpatient Hospital Stay (HOSPITAL_COMMUNITY): Payer: Medicaid Other

## 2022-11-02 DIAGNOSIS — O99214 Obesity complicating childbirth: Secondary | ICD-10-CM | POA: Diagnosis present

## 2022-11-02 DIAGNOSIS — Z23 Encounter for immunization: Secondary | ICD-10-CM

## 2022-11-02 DIAGNOSIS — O24410 Gestational diabetes mellitus in pregnancy, diet controlled: Principal | ICD-10-CM | POA: Diagnosis present

## 2022-11-02 DIAGNOSIS — Z3A39 39 weeks gestation of pregnancy: Secondary | ICD-10-CM

## 2022-11-02 DIAGNOSIS — O09523 Supervision of elderly multigravida, third trimester: Secondary | ICD-10-CM | POA: Diagnosis not present

## 2022-11-02 DIAGNOSIS — Z3A36 36 weeks gestation of pregnancy: Secondary | ICD-10-CM

## 2022-11-02 DIAGNOSIS — O0990 Supervision of high risk pregnancy, unspecified, unspecified trimester: Secondary | ICD-10-CM

## 2022-11-02 DIAGNOSIS — O24420 Gestational diabetes mellitus in childbirth, diet controlled: Secondary | ICD-10-CM | POA: Diagnosis present

## 2022-11-02 LAB — CBC
HCT: 32.9 % — ABNORMAL LOW (ref 36.0–46.0)
Hemoglobin: 10.9 g/dL — ABNORMAL LOW (ref 12.0–15.0)
MCH: 30.2 pg (ref 26.0–34.0)
MCHC: 33.1 g/dL (ref 30.0–36.0)
MCV: 91.1 fL (ref 80.0–100.0)
Platelets: 167 10*3/uL (ref 150–400)
RBC: 3.61 MIL/uL — ABNORMAL LOW (ref 3.87–5.11)
RDW: 13.7 % (ref 11.5–15.5)
WBC: 8.1 10*3/uL (ref 4.0–10.5)
nRBC: 0 % (ref 0.0–0.2)

## 2022-11-02 LAB — RPR: RPR Ser Ql: NONREACTIVE

## 2022-11-02 LAB — GLUCOSE, CAPILLARY
Glucose-Capillary: 77 mg/dL (ref 70–99)
Glucose-Capillary: 81 mg/dL (ref 70–99)

## 2022-11-02 MED ORDER — FENTANYL CITRATE (PF) 100 MCG/2ML IJ SOLN: 50.0000 ug | INTRAMUSCULAR | Status: DC | PRN

## 2022-11-02 MED ORDER — ZOLPIDEM TARTRATE 5 MG PO TABS: 5.0000 mg | ORAL_TABLET | Freq: Every evening | ORAL | Status: DC | PRN

## 2022-11-02 MED ORDER — DIPHENHYDRAMINE HCL 50 MG/ML IJ SOLN: 12.5000 mg | INTRAMUSCULAR | Status: DC | PRN

## 2022-11-02 MED ORDER — PHENYLEPHRINE 80 MCG/ML (10ML) SYRINGE FOR IV PUSH (FOR BLOOD PRESSURE SUPPORT): 80.0000 ug | PREFILLED_SYRINGE | INTRAVENOUS | Status: DC | PRN

## 2022-11-02 MED ORDER — TERBUTALINE SULFATE 1 MG/ML IJ SOLN: 0.2500 mg | Freq: Once | INTRAMUSCULAR | Status: DC | PRN

## 2022-11-02 MED ORDER — WITCH HAZEL-GLYCERIN EX PADS: 1.0000 | MEDICATED_PAD | CUTANEOUS | Status: DC | PRN

## 2022-11-02 MED ORDER — TETANUS-DIPHTH-ACELL PERTUSSIS 5-2.5-18.5 LF-MCG/0.5 IM SUSY
0.5000 mL | PREFILLED_SYRINGE | Freq: Once | INTRAMUSCULAR | Status: AC
  Administered 2022-11-03: 0.5 mL via INTRAMUSCULAR
  Filled 2022-11-02: qty 0.5

## 2022-11-02 MED ORDER — MISOPROSTOL 25 MCG QUARTER TABLET
25.0000 ug | ORAL_TABLET | Freq: Once | ORAL | Status: AC
  Administered 2022-11-02: 25 ug via VAGINAL
  Filled 2022-11-02: qty 1

## 2022-11-02 MED ORDER — OXYTOCIN-SODIUM CHLORIDE 30-0.9 UT/500ML-% IV SOLN
2.5000 [IU]/h | INTRAVENOUS | Status: DC
  Filled 2022-11-02: qty 500

## 2022-11-02 MED ORDER — FENTANYL-BUPIVACAINE-NACL 0.5-0.125-0.9 MG/250ML-% EP SOLN
12.0000 mL/h | EPIDURAL | Status: DC | PRN
  Administered 2022-11-02: 12 mL/h via EPIDURAL

## 2022-11-02 MED ORDER — DIPHENHYDRAMINE HCL 25 MG PO CAPS: 25.0000 mg | ORAL_CAPSULE | Freq: Four times a day (QID) | ORAL | Status: DC | PRN

## 2022-11-02 MED ORDER — FENTANYL-BUPIVACAINE-NACL 0.5-0.125-0.9 MG/250ML-% EP SOLN
EPIDURAL | Status: AC
  Filled 2022-11-02: qty 250

## 2022-11-02 MED ORDER — IBUPROFEN 600 MG PO TABS
600.0000 mg | ORAL_TABLET | Freq: Four times a day (QID) | ORAL | Status: DC
  Administered 2022-11-02 – 2022-11-03 (×4): 600 mg via ORAL
  Filled 2022-11-02 (×4): qty 1

## 2022-11-02 MED ORDER — SOD CITRATE-CITRIC ACID 500-334 MG/5ML PO SOLN: 30.0000 mL | ORAL | Status: DC | PRN

## 2022-11-02 MED ORDER — DIBUCAINE (PERIANAL) 1 % EX OINT: 1.0000 | TOPICAL_OINTMENT | CUTANEOUS | Status: DC | PRN

## 2022-11-02 MED ORDER — LACTATED RINGERS IV SOLN: INTRAVENOUS | Status: DC

## 2022-11-02 MED ORDER — EPHEDRINE 5 MG/ML INJ: 10.0000 mg | INTRAVENOUS | Status: DC | PRN

## 2022-11-02 MED ORDER — MISOPROSTOL 25 MCG QUARTER TABLET
25.0000 ug | ORAL_TABLET | Freq: Once | ORAL | Status: AC
  Administered 2022-11-02: 25 ug via ORAL
  Filled 2022-11-02: qty 1

## 2022-11-02 MED ORDER — SIMETHICONE 80 MG PO CHEW: 80.0000 mg | CHEWABLE_TABLET | ORAL | Status: DC | PRN

## 2022-11-02 MED ORDER — OXYTOCIN-SODIUM CHLORIDE 30-0.9 UT/500ML-% IV SOLN
1.0000 m[IU]/min | INTRAVENOUS | Status: DC
  Administered 2022-11-02: 2 m[IU]/min via INTRAVENOUS

## 2022-11-02 MED ORDER — COCONUT OIL OIL: 1.0000 | TOPICAL_OIL | Status: DC | PRN

## 2022-11-02 MED ORDER — ONDANSETRON HCL 4 MG/2ML IJ SOLN: 4.0000 mg | INTRAMUSCULAR | Status: DC | PRN

## 2022-11-02 MED ORDER — ACETAMINOPHEN 325 MG PO TABS: 650.0000 mg | ORAL_TABLET | ORAL | Status: DC | PRN

## 2022-11-02 MED ORDER — ONDANSETRON HCL 4 MG/2ML IJ SOLN: 4.0000 mg | Freq: Four times a day (QID) | INTRAMUSCULAR | Status: DC | PRN

## 2022-11-02 MED ORDER — PRENATAL MULTIVITAMIN CH
1.0000 | ORAL_TABLET | Freq: Every day | ORAL | Status: DC
  Administered 2022-11-03: 1 via ORAL
  Filled 2022-11-02: qty 1

## 2022-11-02 MED ORDER — ONDANSETRON HCL 4 MG PO TABS: 4.0000 mg | ORAL_TABLET | ORAL | Status: DC | PRN

## 2022-11-02 MED ORDER — LIDOCAINE HCL (PF) 1 % IJ SOLN: 30.0000 mL | INTRAMUSCULAR | Status: DC | PRN

## 2022-11-02 MED ORDER — OXYTOCIN BOLUS FROM INFUSION
333.0000 mL | Freq: Once | INTRAVENOUS | Status: AC
  Administered 2022-11-02: 333 mL via INTRAVENOUS

## 2022-11-02 MED ORDER — LIDOCAINE HCL (PF) 1 % IJ SOLN
INTRAMUSCULAR | Status: DC | PRN
  Administered 2022-11-02: 3 mL via EPIDURAL
  Administered 2022-11-02: 5 mL via EPIDURAL

## 2022-11-02 MED ORDER — SENNOSIDES-DOCUSATE SODIUM 8.6-50 MG PO TABS
2.0000 | ORAL_TABLET | ORAL | Status: DC
  Administered 2022-11-02: 2 via ORAL
  Filled 2022-11-02: qty 2

## 2022-11-02 MED ORDER — LACTATED RINGERS IV SOLN: 500.0000 mL | Freq: Once | INTRAVENOUS | Status: AC

## 2022-11-02 MED ORDER — LACTATED RINGERS IV SOLN: 500.0000 mL | INTRAVENOUS | Status: DC | PRN

## 2022-11-02 MED ORDER — BENZOCAINE-MENTHOL 20-0.5 % EX AERO: 1.0000 | INHALATION_SPRAY | CUTANEOUS | Status: DC | PRN

## 2022-11-02 NOTE — Plan of Care (Signed)
  Problem: Education: Goal: Knowledge of General Education information will improve Description: Including pain rating scale, medication(s)/side effects and non-pharmacologic comfort measures Outcome: Completed/Met   Problem: Health Behavior/Discharge Planning: Goal: Ability to manage health-related needs will improve Outcome: Completed/Met   Problem: Clinical Measurements: Goal: Ability to maintain clinical measurements within normal limits will improve Outcome: Completed/Met Goal: Will remain free from infection Outcome: Completed/Met Goal: Diagnostic test results will improve Outcome: Completed/Met Goal: Respiratory complications will improve Outcome: Completed/Met Goal: Cardiovascular complication will be avoided Outcome: Completed/Met   Problem: Activity: Goal: Risk for activity intolerance will decrease Outcome: Completed/Met   Problem: Nutrition: Goal: Adequate nutrition will be maintained Outcome: Completed/Met   Problem: Coping: Goal: Level of anxiety will decrease Outcome: Completed/Met   Problem: Elimination: Goal: Will not experience complications related to bowel motility Outcome: Completed/Met Goal: Will not experience complications related to urinary retention Outcome: Completed/Met   Problem: Pain Managment: Goal: General experience of comfort will improve Outcome: Completed/Met   Problem: Safety: Goal: Ability to remain free from injury will improve Outcome: Completed/Met   Problem: Skin Integrity: Goal: Risk for impaired skin integrity will decrease Outcome: Completed/Met   Problem: Education: Goal: Knowledge of Childbirth will improve Outcome: Completed/Met Goal: Ability to make informed decisions regarding treatment and plan of care will improve Outcome: Completed/Met Goal: Ability to state and carry out methods to decrease the pain will improve Outcome: Completed/Met Goal: Individualized Educational Video(s) Outcome: Completed/Met    Problem: Coping: Goal: Ability to verbalize concerns and feelings about labor and delivery will improve Outcome: Completed/Met   Problem: Life Cycle: Goal: Ability to make normal progression through stages of labor will improve Outcome: Completed/Met Goal: Ability to effectively push during vaginal delivery will improve Outcome: Completed/Met   Problem: Role Relationship: Goal: Will demonstrate positive interactions with the child Outcome: Completed/Met   Problem: Safety: Goal: Risk of complications during labor and delivery will decrease Outcome: Completed/Met   Problem: Pain Management: Goal: Relief or control of pain from uterine contractions will improve Outcome: Completed/Met   Problem: Education: Goal: Knowledge of Childbirth will improve Outcome: Completed/Met Goal: Ability to make informed decisions regarding treatment and plan of care will improve Outcome: Completed/Met Goal: Ability to state and carry out methods to decrease the pain will improve Outcome: Completed/Met Goal: Individualized Educational Video(s) Outcome: Completed/Met   Problem: Coping: Goal: Ability to verbalize concerns and feelings about labor and delivery will improve Outcome: Completed/Met   Problem: Life Cycle: Goal: Ability to make normal progression through stages of labor will improve Outcome: Completed/Met Goal: Ability to effectively push during vaginal delivery will improve Outcome: Completed/Met   Problem: Role Relationship: Goal: Will demonstrate positive interactions with the child Outcome: Completed/Met   Problem: Safety: Goal: Risk of complications during labor and delivery will decrease Outcome: Completed/Met   Problem: Pain Management: Goal: Relief or control of pain from uterine contractions will improve Outcome: Completed/Met   

## 2022-11-02 NOTE — Anesthesia Procedure Notes (Signed)
Epidural Patient location during procedure: OB Start time: 11/02/2022 10:43 AM End time: 11/02/2022 10:48 AM  Staffing Anesthesiologist: Linton Rump, MD Performed: anesthesiologist   Preanesthetic Checklist Completed: patient identified, IV checked, site marked, risks and benefits discussed, surgical consent, monitors and equipment checked, pre-op evaluation and timeout performed  Epidural Patient position: sitting Prep: DuraPrep and site prepped and draped Patient monitoring: continuous pulse ox and blood pressure Approach: midline Location: L3-L4 Injection technique: LOR saline  Needle:  Needle type: Tuohy  Needle gauge: 17 G Needle length: 9 cm and 9 Needle insertion depth: 5 cm Catheter type: closed end flexible Catheter size: 19 Gauge Catheter at skin depth: 9 cm Test dose: negative  Assessment Events: blood not aspirated, no cerebrospinal fluid, injection not painful, no injection resistance, no paresthesia and negative IV test  Additional Notes The patient has requested an epidural for labor pain management. Risks and benefits including, but not limited to, infection, bleeding, local anesthetic toxicity, headache, hypotension, back pain, block failure, etc. were discussed with the patient. The patient expressed understanding and consented to the procedure. I confirmed that the patient has no bleeding disorders and is not taking blood thinners. I confirmed the patient's last platelet count with the nurse. A time-out was performed immediately prior to the procedure. Please see nursing documentation for vital signs. Sterile technique was used throughout the whole procedure. Once LOR achieved, the epidural catheter threaded easily without resistance. Aspiration of the catheter was negative for blood and CSF. The epidural was dosed slowly and an infusion was started.  1 attempt(s)Reason for block:procedure for pain

## 2022-11-02 NOTE — Plan of Care (Signed)

## 2022-11-02 NOTE — Progress Notes (Signed)
Labor Progress Note Betty Grenier is a 47 y.o. [phone] at [redacted]w[redacted]d presented for IOL for A1GDM, AMA S: doing well. Feeling mild pain with contractions.  O:  BP (!) 119/58 (BP Location: Right Arm)   Pulse 69   Temp 98 F (36.7 C) (Oral)   Resp 17   Ht 5\' 2"  (1.575 m)   Wt 89.8 kg   LMP 02/02/2022   SpO2 95%   BMI 36.21 kg/m  EFM: 135bpm /moderate variability/+accels no decels.   CVE: Dilation: 1.5 Effacement (%): 60 Cervical Position: Posterior Station: -3 Presentation: Vertex Exam by:: Maiyah Goyne   A&P: 47 y.o. [id number] [redacted]w[redacted]d IOL for A1GDM. #Labor: Progressing well. S/y cytotec X 2 rounds. FB placed and well tolerated. Plan to re-eval, and consider AROM +/- pitocin when FB out. #Pain: family support, IV fentanyl, epidural when ready. #FWB: Cat 1 #GBS negative  A1GDM: blood sugars well controlled. Continue q4hr CBG checks.  Anastasio Wogan Lizabeth Leyden, MD 6:32 AM

## 2022-11-02 NOTE — H&P (Signed)
OBSTETRIC ADMISSION HISTORY AND PHYSICAL  Regina Castaneda is a 47 y.o. female [phone] with IUP at [redacted]w[redacted]d by LMP presenting for scheduled IOL due to A1GDM, AMA. She reports +FMs, No LOF, no VB, no blurry vision, headaches or peripheral edema, and RUQ pain.  She plans on breast feeding. She request unsure about birth control. She received her prenatal care at [facility], after being [facility] from the [facility]   Dating: By LMP --->  Estimated Date of Delivery: 11/09/22  Sono:    @[redacted]w[redacted]d , CWD, normal anatomy, cephalic presentation, 2485g, 45% EFW   Prenatal History/Complications:  AMA A1GDM EICF, with low risk NIPs Obesity in pregnancy  Past Medical History: Past Medical History:  Diagnosis Date   Gestational diabetes    Language barrier    spanish   Late prenatal care    No pertinent past medical history    Obese     Past Surgical History: Past Surgical History:  Procedure Laterality Date   NO PAST SURGERIES      Obstetrical History: OB History     Gravida  4   Para  3   Term  3   Preterm  0   AB      Living  3      SAB      IAB      Ectopic      Multiple      Live Births  3           Social History Social History   Socioeconomic History   Marital status: Single    Spouse name: Not on file   Number of children: Not on file   Years of education: Not on file   Highest education level: Not on file  Occupational History   Not on file  Tobacco Use   Smoking status: Never   Smokeless tobacco: Never  Vaping Use   Vaping status: Never Used  Substance and Sexual Activity   Alcohol use: No   Drug use: No   Sexual activity: Yes    Birth control/protection: None  Other Topics Concern   Not on file  Social History Narrative   Not on file   Social Determinants of Health   Financial Resource Strain: Not on file  Food Insecurity: No Food Insecurity (11/02/2022)   Hunger Vital Sign    Worried About Running Out of Food in the Last Year: Never true     Ran Out of Food in the Last Year: Never true  Transportation Needs: No Transportation Needs (11/02/2022)   PRAPARE - Administrator, Civil Service (Medical): No    Lack of Transportation (Non-Medical): No  Physical Activity: Not on file  Stress: Not on file  Social Connections: Not on file    Family History: Family History  Problem Relation Age of Onset   Hypertension Mother    Diabetes Mother    Peripheral vascular disease Mother    Asthma Son    Breast cancer Neg Hx    Heart disease Neg Hx    Cancer Neg Hx     Allergies: No Known Allergies  Medications Prior to Admission  Medication Sig Dispense Refill Last Dose   aspirin EC 81 MG tablet Take 1 tablet (81 mg total) by mouth daily. Take after 12 weeks for prevention of preeclampsia later in pregnancy 300 tablet 2 11/01/2022   ferrous sulfate 324 MG TBEC Take 1 tablet (324 mg total) by mouth daily. 60 tablet 0 11/01/2022  Prenatal Vit-Fe Fumarate-FA (PRENATAL MULTIVITAMIN) TABS Take 1 tablet by mouth daily.   11/01/2022     Review of Systems   All systems reviewed and negative except as stated in HPI  Blood pressure (!) 119/58, pulse 69, temperature 98 F (36.7 C), temperature source Oral, resp. rate 17, height 5\' 2"  (1.575 m), weight 89.8 kg, last menstrual period 02/02/2022, SpO2 95%. General appearance: alert, cooperative, and appears stated age Lungs: clear to auscultation bilaterally Heart: regular rate and rhythm Abdomen: soft, non-tender; bowel sounds normal Pelvic: see below Extremities: Homans sign is negative, no sign of DVT  Presentation: cephalic  Fetal monitoring: 135 bpm, moderate variability, + accelerations, no decelerations  Uterine activity: occasional contractions. Dilation: 1.5 Effacement (%): 60 Station: -3 Exam by:: Regina Castaneda   Prenatal labs: ABO, Rh: --/--/A POS (07/14 0050) Antibody: NEG (07/14 0050) Rubella: Immune (02/15 0000) RPR: Non Reactive (05/02 1524)  HBsAg: Negative  (02/15 0000)  HIV: Non Reactive (05/02 1524)  GBS: Negative/-- (06/26 0229)  2 hr Glucola abnormal Genetic screening  low risk Anatomy US - EICF, isolated  Prenatal Transfer Tool  Maternal Diabetes: Yes:  Diabetes Type:  Diet controlled Genetic Screening: Normal Maternal Ultrasounds/Referrals: Isolated EIF (echogenic intracardiac focus) Fetal Ultrasounds or other Referrals:  None Maternal Substance Abuse:  No Significant Maternal Medications:  None Significant Maternal Lab Results:  Group B Strep negative Number of Prenatal Visits:Less than or equal to 3 verified prenatal visits Other Comments:  None  Results for orders placed or performed during the hospital encounter of 11/02/22 (from the past 24 hour(s))  Type and screen   Collection Time: 11/02/22 12:50 AM  Result Value Ref Range   ABO/RH(D) A POS    Antibody Screen NEG    Sample Expiration      11/05/2022,2359 Performed at [facility], [address] [place], [place] [address]   CBC   Collection Time: 11/02/22 12:52 AM  Result Value Ref Range   WBC 8.1 4.0 - 10.5 K/uL   RBC 3.61 (L) 3.87 - 5.11 MIL/uL   Hemoglobin 10.9 (L) 12.0 - 15.0 g/dL   HCT 60.4 (L) 54.0 - 98.1 %   MCV 91.1 80.0 - 100.0 fL   MCH 30.2 26.0 - 34.0 pg   MCHC 33.1 30.0 - 36.0 g/dL   RDW 19.1 47.8 - 29.5 %   Platelets 167 150 - 400 K/uL   nRBC 0.0 0.0 - 0.2 %    Patient Active Problem List   Diagnosis Date Noted   GDM, class A1 11/02/2022   Elevated blood lead level 10/15/2022   Lipoma of neck 10/02/2022   Language barrier 07/14/2022   Supervision of high risk pregnancy, antepartum 06/20/2022   AMA (advanced maternal age) multigravida 35+ 06/20/2022   GDM diagnosed at 77 weeks 06/20/2022    Assessment/Plan:  Regina Castaneda is a 47 y.o. A2Z3086 at [redacted]w[redacted]d here for scheduled IOL due to A1GDM, AMA, obesity.   #Labor:admit to labor and delivery. IOL started with cytotec 50 PO, 25 vaginal. Will plan for a FB at next  check. #Pain: Family support, IV fentanyl, epidural when ready  #FWB: Cat 1 #ID:  GBS negative #MOF: breast  #MOC: unsure #Circ:  none  Sheppard Evens MD MPH OB Fellow, Faculty Practice [facility], Center for [facility] 11/02/2022

## 2022-11-02 NOTE — Discharge Summary (Signed)
Postpartum Discharge Summary     Patient Name: Regina Castaneda DOB: [birth year] MRN: [id number]  Date of admission: 11/02/2022 Delivery date:11/02/2022 Delivering provider: Jacklyn Shell Date of discharge: 11/03/2022  Admitting diagnosis: GDM, class A1 [O24.410] Intrauterine pregnancy: [redacted]w[redacted]d     Secondary diagnosis:  Principal Problem:   GDM, class A1 Active Problems:   Vaginal delivery  Additional problems: AMA    Discharge diagnosis: Term Pregnancy Delivered, GDM A1, and AMA                                               Post partum procedures: none Augmentation: Pitocin, Cytotec, and IP Foley Complications: None  Hospital course: Induction of Labor With Vaginal Delivery   47 y.o. yo [id number] at [redacted]w[redacted]d was admitted to the hospital 11/02/2022 for induction of labor.  Indication for induction: A1 DM.  Patient had an labor course complicated by nothing Membrane Rupture Time/Date: 11:37 AM,11/02/2022  Delivery Method:Vaginal, Spontaneous Episiotomy: None Lacerations:  1st degree Details of delivery can be found in separate delivery note.  Patient had a postpartum course complicated by: uneventful . Patient is discharged home 11/03/22.  Newborn Data: Birth date:11/02/2022 Birth time:11:59 AM Gender:Female Living status:Living Apgars:9 ,9  Weight:3270 g  Magnesium Sulfate received: No BMZ received: No Rhophylac:N/A MMR:N/A T-DaP: Declined prenatally Flu: N/A Transfusion:No  Physical exam  Vitals:   11/02/22 1510 11/02/22 1927 11/02/22 2335 11/03/22 0430  BP: 135/65 (!) 108/56 (!) 120/55 (!) 106/54  Pulse: 72 83 87 67  Resp: 18 17 18 16   Temp: 98.3 F (36.8 C)  98.1 F (36.7 C) 98 F (36.7 C)  TempSrc: Oral Oral Oral Oral  SpO2: 98% 98% 100% 100%  Weight:      Height:       General: alert, cooperative, and no distress Lochia: appropriate Uterine Fundus: firm Incision: N/A DVT Evaluation: Mild LE edema, no signs of DVT Labs: Lab Results  Component  Value Date   WBC 8.1 11/02/2022   HGB 10.9 (L) 11/02/2022   HCT 32.9 (L) 11/02/2022   MCV 91.1 11/02/2022   PLT 167 11/02/2022      Latest Ref Rng & Units 07/04/2009    8:40 PM  CMP  Glucose 70 - 99 mg/dL 81   BUN 6 - 23 mg/dL 9   Creatinine 5.62 - 1.30 mg/dL 8.65   Sodium 784 - 696 meq/L 139   Potassium 3.5 - 5.3 meq/L 3.9   Chloride 96 - 112 meq/L 105   CO2 19 - 32 meq/L 18   Calcium 8.4 - 10.5 mg/dL 8.9   Total Protein 6.0 - 8.3 g/dL 7.8   Total Bilirubin 0.3 - 1.2 mg/dL 0.3   Alkaline Phos 39 - 117 units/L 53   AST 0 - 37 units/L 15   ALT 0 - 35 units/L 17    Edinburgh Score:    11/02/2022    7:28 PM  Edinburgh Postnatal Depression Scale Screening Tool  I have been able to laugh and see the funny side of things. --     After visit meds:  Allergies as of 11/03/2022   No Known Allergies      Medication List     STOP taking these medications    aspirin EC 81 MG tablet       TAKE these medications    acetaminophen 325 MG tablet Commonly  known as: Tylenol Take 2 tablets (650 mg total) by mouth every 4 (four) hours as needed (for pain scale < 4).   ferrous sulfate 324 MG Tbec Take 1 tablet (324 mg total) by mouth daily.   ibuprofen 600 MG tablet Commonly known as: ADVIL Take 1 tablet (600 mg total) by mouth every 6 (six) hours.   polyethylene glycol powder 17 GM/SCOOP powder Commonly known as: GLYCOLAX/MIRALAX Take 17 g by mouth daily as needed.   prenatal multivitamin Tabs tablet Take 1 tablet by mouth daily.         Discharge home in stable condition Infant Feeding: Breast Infant Disposition:home with mother Discharge instruction: per After Visit Summary and Postpartum booklet. Activity: Advance as tolerated. Pelvic rest for 6 weeks.  Diet: routine diet Future Appointments:No future appointments. Follow up Visit:   Please schedule this patient for a In person postpartum visit in 4 weeks with the following provider: Any  provider. Additional Postpartum F/U:2 hour GTT  Low risk pregnancy complicated by: GDM Delivery mode:  Vaginal, Spontaneous Anticipated Birth Control:  Unsure   11/03/2022 [name], MD

## 2022-11-02 NOTE — Anesthesia Preprocedure Evaluation (Signed)
Anesthesia Evaluation  Patient identified by MRN, date of birth, ID band Patient awake    Reviewed: Allergy & Precautions, NPO status , Patient's Chart, lab work & pertinent test results  History of Anesthesia Complications Negative for: history of anesthetic complications  Airway Mallampati: III  TM Distance: >3 FB Neck ROM: Full    Dental   Pulmonary neg pulmonary ROS   Pulmonary exam normal breath sounds clear to auscultation       Cardiovascular negative cardio ROS  Rhythm:Regular Rate:Normal     Neuro/Psych negative neurological ROS     GI/Hepatic negative GI ROS, Neg liver ROS,,,  Endo/Other  diabetes, Gestational    Renal/GU negative Renal ROS     Musculoskeletal   Abdominal  (+) + obese  Peds  Hematology negative hematology ROS (+) Lab Results      Component                Value               Date                      WBC                      8.1                 11/02/2022                HGB                      10.9 (L)            11/02/2022                HCT                      32.9 (L)            11/02/2022                MCV                      91.1                11/02/2022                PLT                      167                 11/02/2022              Anesthesia Other Findings   Reproductive/Obstetrics (+) Pregnancy                              Anesthesia Physical Anesthesia Plan  ASA: 2  Anesthesia Plan: Epidural   Post-op Pain Management:    Induction:   PONV Risk Score and Plan:   Airway Management Planned:   Additional Equipment:   Intra-op Plan:   Post-operative Plan:   Informed Consent: I have reviewed the patients History and Physical, chart, labs and discussed the procedure including the risks, benefits and alternatives for the proposed anesthesia with the patient or authorized representative who has indicated his/her understanding and  acceptance.       Plan Discussed with: Anesthesiologist  Anesthesia Plan Comments: (  I have discussed risks of neuraxial anesthesia including but not limited to infection, bleeding, nerve injury, back pain, headache, seizures, and failure of block. Patient denies bleeding disorders and is not currently anticoagulated. Labs have been reviewed. Risks and benefits discussed. All patient's questions answered.  )         Anesthesia Quick Evaluation

## 2022-11-03 LAB — TYPE AND SCREEN
ABO/RH(D): A POS
ABO/RH(D): A POS
Antibody Screen: NEGATIVE
Antibody Screen: NEGATIVE

## 2022-11-03 MED ORDER — ACETAMINOPHEN 325 MG PO TABS: 650.0000 mg | ORAL_TABLET | ORAL | 0 refills | Status: AC | PRN

## 2022-11-03 MED ORDER — IBUPROFEN 600 MG PO TABS: 600.0000 mg | ORAL_TABLET | Freq: Four times a day (QID) | ORAL | 0 refills | Status: AC

## 2022-11-03 MED ORDER — POLYETHYLENE GLYCOL 3350 17 GM/SCOOP PO POWD: 17.0000 g | Freq: Every day | ORAL | 1 refills | Status: AC | PRN

## 2022-11-03 NOTE — Progress Notes (Signed)
Blood bank notified writer on new "in process" type and screen on patient that differs from previous blood type. Lab tech cancelled previous resulted type and screen prior to running new specimen that did not belong to patient. New order placed for type and screen in the event of emergency due to patient being only 13 hours postpartum.   Elvia Collum, RN 11/03/22

## 2022-11-03 NOTE — Lactation Note (Signed)
This note was copied from a baby's chart. Lactation Consultation Note  Patient Name: Boy Kaizlee Carlino [id number] Date: 11/03/2022 Age:45 hours Reason for consult: Initial assessment;Maternal endocrine disorder;Term FOB stated mom is BF and formula feeding baby and is doing good. Mom is BF before giving formula.  Baby has voided and had stool all ready per FOB. Mom and FOB answered questions.  Mom encouraged to feed baby 8-12 times/24 hours and with feeding cues.  Encouraged to BF before formula.  Gave Lactation brochure in Spanish. Encouraged to call for assistance or questions. Mom stated she is doing good.   Maternal Data Does the patient have breastfeeding experience prior to this delivery?: Yes How long did the patient breastfeed?: 1 yr each to her now 29, 1,11 yr old children  Feeding Mother's Current Feeding Choice: Breast Milk and Formula Nipple Type: Extra Slow Flow  LATCH Score                    Lactation Tools Discussed/Used    Interventions    Discharge    Consult Status Consult Status: PRN Date: 11/03/22 Follow-up type: In-patient    Charyl Dancer 11/03/2022, 3:39 AM

## 2022-11-03 NOTE — Progress Notes (Signed)
Ordered pt meals by Viria Alvarez Spanish Medical Interpreter. 

## 2022-11-03 NOTE — Anesthesia Postprocedure Evaluation (Signed)
Anesthesia Post Note  Patient: Islay Polanco  Procedure(s) Performed: AN AD HOC LABOR EPIDURAL     Patient location during evaluation: Mother Baby Anesthesia Type: Epidural Level of consciousness: awake, oriented and awake and alert Pain management: pain level controlled Vital Signs Assessment: post-procedure vital signs reviewed and stable Respiratory status: spontaneous breathing, respiratory function stable and nonlabored ventilation Cardiovascular status: stable Postop Assessment: no headache, adequate PO intake, able to ambulate, patient able to bend at knees and no apparent nausea or vomiting Anesthetic complications: no   No notable events documented.  Last Vitals:  Vitals:   11/02/22 2335 11/03/22 0430  BP: (!) 120/55 (!) 106/54  Pulse: 87 67  Resp: 18 16  Temp: 36.7 C 36.7 C  SpO2: 100% 100%    Last Pain:  Vitals:   11/03/22 0846  TempSrc:   PainSc: 0-No pain   Pain Goal: Patients Stated Pain Goal: 2 (11/02/22 1510)                 Leone Putman

## 2022-11-04 ENCOUNTER — Encounter: Payer: Self-pay | Admitting: Obstetrics & Gynecology

## 2022-11-10 ENCOUNTER — Telehealth: Payer: Self-pay

## 2022-11-10 NOTE — Telephone Encounter (Signed)
Message received from [place] with [facility] requesting follow up information on lead levels. Per chart review no lead level rechecked since transfer of care from health department. Attempted to contact Morrie Sheldon, no answer and no voicemail message identifying secure voicemail. Will try to contact a second time.

## 2022-11-25 ENCOUNTER — Ambulatory Visit: Payer: Self-pay | Admitting: Surgery

## 2022-11-28 ENCOUNTER — Other Ambulatory Visit: Payer: Self-pay

## 2022-11-28 ENCOUNTER — Telehealth (HOSPITAL_COMMUNITY): Payer: Self-pay | Admitting: *Deleted

## 2022-11-28 ENCOUNTER — Encounter (HOSPITAL_BASED_OUTPATIENT_CLINIC_OR_DEPARTMENT_OTHER): Payer: Self-pay | Admitting: Surgery

## 2022-11-28 NOTE — Progress Notes (Addendum)
Spoke w/ via phone for pre-op interview---pt Lab needs dos----   I stat    urine preg      Lab results------none COVID test -----patient states asymptomatic no test needed Arrive at -------800 11-28-2022 NPO after MN NO Solid Food.  Clear liquids from MN until---700 Med rec completed Medications to take morning of surgery -----none Diabetic medication -----n/a Patient instructed no nail polish to be worn day of surgery Patient instructed to bring photo id and insurance card day of surgery Patient aware to have Driver (ride ) / caregiver   husband Regina Castaneda  for 24 hours after surgery  Patient Special Instructions -----none Pre-Op special Instructions -----none Patient verbalized understanding of instructions that were given at this phone interview. Patient denies shortness of breath, chest pain, fever, cough at this phone interview.  Spanish interpreter requested email on chart

## 2022-11-28 NOTE — Telephone Encounter (Signed)
11/28/2022  Name: Regina Castaneda MRN: [id number] DOB: 1975-08-14  Reason for Call:  Transition of Care Hospital Discharge Call  Contact Status: Patient Contact Status: Complete  Language assistant needed: Interpreter Mode: Telephonic Interpreter Interpreter Name: Glennon Mac [phone]        Follow-Up Questions: Do You Have Any Concerns About Your Health As You Heal From Delivery?: No Do You Have Any Concerns About Your Infants Health?: No  Edinburgh Postnatal Depression Scale:  In the Past 7 Days: I have been able to laugh and see the funny side of things.: As much as I always could I have looked forward with enjoyment to things.: As much as I ever did I have blamed myself unnecessarily when things went wrong.: No, never I have been anxious or worried for no good reason.: No, not at all I have felt scared or panicky for no good reason.: No, not at all Things have been getting on top of me.: No, I have been coping as well as ever I have been so unhappy that I have had difficulty sleeping.: Not at all I have felt sad or miserable.: No, not at all I have been so unhappy that I have been crying.: No, never The thought of harming myself has occurred to me.: Never Edinburgh Postnatal Depression Scale Total: 0  PHQ2-9 Depression Scale:     Discharge Follow-up: Edinburgh score requires follow up?: No  Post-discharge interventions: Reviewed Newborn Safe Sleep Practices  Salena Saner, RN 11/28/2022 11:45

## 2022-12-08 ENCOUNTER — Ambulatory Visit (HOSPITAL_BASED_OUTPATIENT_CLINIC_OR_DEPARTMENT_OTHER): Payer: Self-pay

## 2022-12-08 ENCOUNTER — Ambulatory Visit (HOSPITAL_BASED_OUTPATIENT_CLINIC_OR_DEPARTMENT_OTHER)
Admission: RE | Admit: 2022-12-08 | Discharge: 2022-12-08 | Disposition: A | Discharge status: Home / Self Care | Payer: Self-pay | Attending: Surgery | Admitting: Surgery

## 2022-12-08 ENCOUNTER — Other Ambulatory Visit: Payer: Self-pay

## 2022-12-08 ENCOUNTER — Encounter (HOSPITAL_BASED_OUTPATIENT_CLINIC_OR_DEPARTMENT_OTHER): Payer: Self-pay | Admitting: Surgery

## 2022-12-08 ENCOUNTER — Encounter (HOSPITAL_BASED_OUTPATIENT_CLINIC_OR_DEPARTMENT_OTHER)
Admission: RE | Disposition: A | Discharge status: Home / Self Care | Payer: Self-pay | Source: Home / Self Care | Attending: Surgery

## 2022-12-08 DIAGNOSIS — L720 Epidermal cyst: Secondary | ICD-10-CM | POA: Insufficient documentation

## 2022-12-08 DIAGNOSIS — Z01818 Encounter for other preprocedural examination: Secondary | ICD-10-CM

## 2022-12-08 DIAGNOSIS — L723 Sebaceous cyst: Secondary | ICD-10-CM

## 2022-12-08 DIAGNOSIS — D170 Benign lipomatous neoplasm of skin and subcutaneous tissue of head, face and neck: Secondary | ICD-10-CM | POA: Diagnosis present

## 2022-12-08 DIAGNOSIS — E119 Type 2 diabetes mellitus without complications: Secondary | ICD-10-CM | POA: Insufficient documentation

## 2022-12-08 HISTORY — PX: MASS EXCISION: SHX2000

## 2022-12-08 HISTORY — DX: Benign lipomatous neoplasm, unspecified: D17.9

## 2022-12-08 LAB — POCT I-STAT, CHEM 8
BUN: 10 mg/dL (ref 6–20)
Calcium, Ion: 1.27 mmol/L (ref 1.15–1.40)
Chloride: 107 mmol/L (ref 98–111)
Creatinine, Ser: 0.4 mg/dL — ABNORMAL LOW (ref 0.44–1.00)
Glucose, Bld: 104 mg/dL — ABNORMAL HIGH (ref 70–99)
HCT: 38 % (ref 36.0–46.0)
Hemoglobin: 12.9 g/dL (ref 12.0–15.0)
Potassium: 4 mmol/L (ref 3.5–5.1)
Sodium: 141 mmol/L (ref 135–145)
TCO2: 23 mmol/L (ref 22–32)

## 2022-12-08 LAB — POCT PREGNANCY, URINE: Preg Test, Ur: NEGATIVE

## 2022-12-08 SURGERY — EXCISION MASS
Anesthesia: General | Site: Neck | Laterality: Left

## 2022-12-08 MED ORDER — ONDANSETRON HCL 4 MG/2ML IJ SOLN
INTRAMUSCULAR | Status: AC
  Filled 2022-12-08: qty 2

## 2022-12-08 MED ORDER — CEFAZOLIN SODIUM-DEXTROSE 2-4 GM/100ML-% IV SOLN
2.0000 g | INTRAVENOUS | Status: AC
  Administered 2022-12-08: 2 g via INTRAVENOUS

## 2022-12-08 MED ORDER — MIDAZOLAM HCL 5 MG/5ML IJ SOLN
INTRAMUSCULAR | Status: DC | PRN
  Administered 2022-12-08: 2 mg via INTRAVENOUS

## 2022-12-08 MED ORDER — LIDOCAINE 2% (20 MG/ML) 5 ML SYRINGE
INTRAMUSCULAR | Status: DC | PRN
  Administered 2022-12-08: 40 mg via INTRAVENOUS

## 2022-12-08 MED ORDER — LIDOCAINE HCL (PF) 2 % IJ SOLN
INTRAMUSCULAR | Status: AC
  Filled 2022-12-08: qty 5

## 2022-12-08 MED ORDER — FENTANYL CITRATE (PF) 100 MCG/2ML IJ SOLN: 25.0000 ug | INTRAMUSCULAR | Status: DC | PRN

## 2022-12-08 MED ORDER — DEXAMETHASONE SODIUM PHOSPHATE 10 MG/ML IJ SOLN
INTRAMUSCULAR | Status: DC | PRN
  Administered 2022-12-08: 5 mg via INTRAVENOUS

## 2022-12-08 MED ORDER — CHLORHEXIDINE GLUCONATE CLOTH 2 % EX PADS: 6.0000 | MEDICATED_PAD | Freq: Once | CUTANEOUS | Status: DC

## 2022-12-08 MED ORDER — 0.9 % SODIUM CHLORIDE (POUR BTL) OPTIME
TOPICAL | Status: DC | PRN
  Administered 2022-12-08: 500 mL

## 2022-12-08 MED ORDER — ACETAMINOPHEN 10 MG/ML IV SOLN: 1000.0000 mg | Freq: Once | INTRAVENOUS | Status: DC | PRN

## 2022-12-08 MED ORDER — EPHEDRINE SULFATE-NACL 50-0.9 MG/10ML-% IV SOSY
PREFILLED_SYRINGE | INTRAVENOUS | Status: DC | PRN
Start: 2022-12-08 — End: 2022-12-08
  Administered 2022-12-08: 10 mg via INTRAVENOUS

## 2022-12-08 MED ORDER — DEXAMETHASONE SODIUM PHOSPHATE 10 MG/ML IJ SOLN
INTRAMUSCULAR | Status: AC
  Filled 2022-12-08: qty 1

## 2022-12-08 MED ORDER — FENTANYL CITRATE (PF) 100 MCG/2ML IJ SOLN
INTRAMUSCULAR | Status: DC | PRN
  Administered 2022-12-08: 25 ug via INTRAVENOUS
  Administered 2022-12-08: 50 ug via INTRAVENOUS

## 2022-12-08 MED ORDER — BUPIVACAINE HCL 0.5 % IJ SOLN
INTRAMUSCULAR | Status: DC | PRN
  Administered 2022-12-08: 20 mL

## 2022-12-08 MED ORDER — TRAMADOL HCL 50 MG PO TABS
50.0000 mg | ORAL_TABLET | Freq: Four times a day (QID) | ORAL | 0 refills | Status: AC | PRN
Start: 2022-12-08 — End: 2023-12-08

## 2022-12-08 MED ORDER — EPHEDRINE 5 MG/ML INJ
INTRAVENOUS | Status: AC
  Filled 2022-12-08: qty 5

## 2022-12-08 MED ORDER — KETOROLAC TROMETHAMINE 30 MG/ML IJ SOLN
INTRAMUSCULAR | Status: AC
  Filled 2022-12-08: qty 1

## 2022-12-08 MED ORDER — FENTANYL CITRATE (PF) 100 MCG/2ML IJ SOLN
INTRAMUSCULAR | Status: AC
  Filled 2022-12-08: qty 2

## 2022-12-08 MED ORDER — CEFAZOLIN SODIUM-DEXTROSE 2-4 GM/100ML-% IV SOLN
INTRAVENOUS | Status: AC
  Filled 2022-12-08: qty 100

## 2022-12-08 MED ORDER — MIDAZOLAM HCL 2 MG/2ML IJ SOLN
INTRAMUSCULAR | Status: AC
  Filled 2022-12-08: qty 2

## 2022-12-08 MED ORDER — PROPOFOL 10 MG/ML IV BOLUS
INTRAVENOUS | Status: AC
  Filled 2022-12-08: qty 20

## 2022-12-08 MED ORDER — PROPOFOL 10 MG/ML IV BOLUS
INTRAVENOUS | Status: DC | PRN
  Administered 2022-12-08: 160 mg via INTRAVENOUS

## 2022-12-08 MED ORDER — LACTATED RINGERS IV SOLN: INTRAVENOUS | Status: DC

## 2022-12-08 SURGICAL SUPPLY — 40 items
APL PRP STRL LF DISP 70% ISPRP (MISCELLANEOUS)
BLADE SURG 15 STRL LF DISP TIS (BLADE) ×1 IMPLANT
BLADE SURG 15 STRL SS (BLADE) ×1
CHLORAPREP W/TINT 26 (MISCELLANEOUS) ×1 IMPLANT
CLEANER CAUTERY TIP PAD (MISCELLANEOUS) IMPLANT
COVER BACK TABLE 60X90IN (DRAPES) ×1 IMPLANT
COVER MAYO STAND STRL (DRAPES) ×1 IMPLANT
DRAPE LAPAROTOMY 100X72 PEDS (DRAPES) IMPLANT
DRAPE SHEET LG 3/4 BI-LAMINATE (DRAPES) IMPLANT
DRAPE UTILITY XL STRL (DRAPES) ×1 IMPLANT
DRSG TEGADERM 2-3/8X2-3/4 SM (GAUZE/BANDAGES/DRESSINGS) IMPLANT
DRSG TEGADERM 4X4.75 (GAUZE/BANDAGES/DRESSINGS) IMPLANT
ELECT REM PT RETURN 9FT ADLT (ELECTROSURGICAL) ×1
ELECTRODE REM PT RTRN 9FT ADLT (ELECTROSURGICAL) ×1 IMPLANT
GAUZE 4X4 16PLY ~~LOC~~+RFID DBL (SPONGE) ×1 IMPLANT
GAUZE SPONGE 4X4 12PLY STRL (GAUZE/BANDAGES/DRESSINGS) ×1 IMPLANT
GLOVE SURG ORTHO 8.0 STRL STRW (GLOVE) ×1 IMPLANT
GLOVE SURG SS PI 7.5 STRL IVOR (GLOVE) IMPLANT
GOWN STRL REUS W/TWL LRG LVL3 (GOWN DISPOSABLE) ×1 IMPLANT
GOWN STRL REUS W/TWL XL LVL3 (GOWN DISPOSABLE) ×1 IMPLANT
KIT TURNOVER CYSTO (KITS) ×1 IMPLANT
NDL HYPO 25X1 1.5 SAFETY (NEEDLE) ×1 IMPLANT
NEEDLE HYPO 25X1 1.5 SAFETY (NEEDLE) ×1
NS IRRIG 500ML POUR BTL (IV SOLUTION) ×1 IMPLANT
PACK BASIN DAY SURGERY FS (CUSTOM PROCEDURE TRAY) ×1 IMPLANT
PENCIL SMOKE EVACUATOR (MISCELLANEOUS) ×1 IMPLANT
SLEEVE SCD COMPRESS KNEE MED (STOCKING) ×1 IMPLANT
STRIP CLOSURE SKIN 1/2X4 (GAUZE/BANDAGES/DRESSINGS) ×1 IMPLANT
SUT ETHILON 3 0 PS 1 (SUTURE) IMPLANT
SUT ETHILON 4 0 PS 2 18 (SUTURE) IMPLANT
SUT MNCRL AB 4-0 PS2 18 (SUTURE) ×1 IMPLANT
SUT VIC AB 3-0 SH 18 (SUTURE) IMPLANT
SUT VIC AB 3-0 SH 8-18 (SUTURE) ×1 IMPLANT
SUT VIC AB 4-0 PS2 18 (SUTURE) IMPLANT
SYR BULB EAR ULCER 3OZ GRN STR (SYRINGE) IMPLANT
SYR CONTROL 10ML LL (SYRINGE) ×1 IMPLANT
TOWEL OR 17X24 6PK STRL BLUE (TOWEL DISPOSABLE) ×1 IMPLANT
TUBE CONNECTING 12X1/4 (SUCTIONS) IMPLANT
WATER STERILE IRR 500ML POUR (IV SOLUTION) IMPLANT
YANKAUER SUCT BULB TIP NO VENT (SUCTIONS) IMPLANT

## 2022-12-08 NOTE — Progress Notes (Signed)
Interpreter was [employer]

## 2022-12-08 NOTE — Discharge Instructions (Addendum)
  [facility] -- DISCHARGE INSTRUCTIONS  REMINDER:   Carry a list of your medications and allergies with you at all times  Call your pharmacy at least 1 week in advance to refill prescriptions  Do not mix any prescribed pain medicine with alcohol  Do not drive any motor vehicles while taking pain medication  Take medications with food unless otherwise directed  Follow-up appointments (date to return to physician): Please call [phone] to confirm your follow up appointment with your surgeon.  Call your Surgeon if you have:  Temperature greater than 101.0  Persistent nausea and vomiting  Severe uncontrolled pain  Redness, tenderness, or signs of infection (pain, swelling, redness, odor or green/yellow discharge around the site)  Difficulty breathing, headache or visual disturbances  Hives  Persistent dizziness or light-headedness  Any other questions or concerns you may have after discharge  In an emergency, call 911 or go to an Emergency Department at a nearby hospital.  Diet: Begin with liquids, and if they are tolerated, resume your usual diet.  Avoid spicy, greasy or heavy foods.  If you have nausea or vomiting, go back to liquids.  If you cannot keep liquids down, call your doctor.  Avoid alcohol consumption while on prescription pain medications. Good nutrition promotes healing. Increase fiber and fluids.   ADDITIONAL INSTRUCTIONS: Use ice pack for 48 hours and as needed.  Leave Dermabond in place for 7-10 days.  May shower.  [facility] Office: [phone]

## 2022-12-08 NOTE — Anesthesia Procedure Notes (Signed)
Procedure Name: LMA Insertion Date/Time: 12/08/2022 10:39 AM  Performed by: Briant Sites, CRNAPre-anesthesia Checklist: Patient identified, Emergency Drugs available, Suction available and Patient being monitored Patient Re-evaluated:Patient Re-evaluated prior to induction Oxygen Delivery Method: Circle system utilized Preoxygenation: Pre-oxygenation with 100% oxygen Induction Type: IV induction Ventilation: Mask ventilation without difficulty LMA: LMA inserted LMA Size: 4.0 Number of attempts: 1 Airway Equipment and Method: Bite block Placement Confirmation: positive ETCO2 Tube secured with: Tape Dental Injury: Teeth and Oropharynx as per pre-operative assessment

## 2022-12-08 NOTE — H&P (Signed)
REFERRING PHYSICIAN: Schulter Bing, MD  PROVIDER: Allahna Husband Myra Rude, MD   Chief Complaint: New Consultation (Neck mass)  History of Present Illness:  Patient is referred by Dr. [place] Bing for surgical evaluation and management of an enlarging soft tissue mass on the left posterior neck. Patient states this has been present for several years. It is gradually increased in size. It is now causing discomfort. Patient is referred for surgical resection. Patient has no prior history of any other such lesions being removed. She has no other lesions of this type on her torso or extremities. She denies any history of infection or drainage. She is accompanied by a family member who assist with interpretation.  Review of Systems: A complete review of systems was obtained from the patient. I have reviewed this information and discussed as appropriate with the patient. See HPI as well for other ROS.  Review of Systems  Constitutional: Negative.  HENT: Negative.  Eyes: Negative.  Respiratory: Negative.  Cardiovascular: Negative.  Gastrointestinal: Negative.  Genitourinary: Negative.  Musculoskeletal: Negative.  Skin: Negative.  Neurological: Negative.  Endo/Heme/Allergies: Negative.  Psychiatric/Behavioral: Negative.    Medical History: History reviewed. No pertinent past medical history.  Patient Active Problem List  Diagnosis  Mass of soft tissue of neck   History reviewed. No pertinent surgical history.   No Known Allergies  No current outpatient medications on file prior to visit.   No current facility-administered medications on file prior to visit.   Family History  Problem Relation Age of Onset  High blood pressure (Hypertension) Mother  Diabetes Mother    Social History   Tobacco Use  Smoking Status Never  Smokeless Tobacco Never    Social History   Socioeconomic History  Marital status: Single  Tobacco Use  Smoking status: Never  Smokeless  tobacco: Never  Substance and Sexual Activity  Alcohol use: Never  Drug use: Never   Social Determinants of Health   Food Insecurity: No Food Insecurity (11/02/2022)  Received from [facility]  Hunger Vital Sign  Worried About Running Out of Food in the Last Year: Never true  Ran Out of Food in the Last Year: Never true  Transportation Needs: No Transportation Needs (11/02/2022)  Received from [facility]  Lack of Transportation (Medical): No  Lack of Transportation (Non-Medical): No   Objective:   Vitals:  BP: 114/60  Pulse: 80  Temp: 36.7 C (98 F)  SpO2: 98%  Weight: 81.6 kg (180 lb)  Height: 152.4 cm (5')  PainSc: 0-No pain   Body mass index is 35.15 kg/m.  Physical Exam   GENERAL APPEARANCE Comfortable, no acute issues Development: normal Gross deformities: none  SKIN Rash, lesions, ulcers: none Induration, erythema: none Nodules: none palpable  EYES Conjunctiva and lids: normal Pupils: equal and reactive  EARS, NOSE, MOUTH, THROAT External ears: no lesion or deformity External nose: no lesion or deformity Hearing: grossly normal  NECK On the posterior lateral left neck overlying the edge of the trapezius muscle is a well-defined relatively firm soft tissue mass measuring 5 cm x 5 cm x 3 cm in size consistent with a lipoma. There are no overlying cutaneous changes. There is no tenderness.  ABDOMEN Not assessed  GENITOURINARY/RECTAL Not assessed  MUSCULOSKELETAL Station and gait: normal Digits and nails: no clubbing or cyanosis Muscle strength: grossly normal all extremities Range of motion: grossly normal all extremities Deformity: none  PSYCHIATRIC Oriented to person, place, and time: yes Mood and  affect: normal for situation Judgment and insight: appropriate for situation   Assessment and Plan:   Mass of soft tissue of neck  Patient is referred by her primary care provider for surgical evaluation and  management of an enlarging soft tissue mass on the left posterior neck which has become recently symptomatic. On examination this appears to be a lipoma.  Today we discussed surgical excision as an outpatient surgical procedure. I explained to them the size and location of the surgical incision. I explained that this will be submitted to pathology for review. We discussed the surgical wound to anticipate and its postoperative care. The patient and her family understand and wish to proceed in the near future.   Darnell Level, MD [facility] practice Office: [phone]

## 2022-12-08 NOTE — Anesthesia Postprocedure Evaluation (Signed)
Anesthesia Post Note  Patient: Regina Castaneda  Procedure(s) Performed: EXCISION SOFT TISSUE MASS LEFT POSTERIOR NECK (Left: Neck)     Patient location during evaluation: PACU Anesthesia Type: General Level of consciousness: awake and alert Pain management: pain level controlled Vital Signs Assessment: post-procedure vital signs reviewed and stable Respiratory status: spontaneous breathing, nonlabored ventilation, respiratory function stable and patient connected to nasal cannula oxygen Cardiovascular status: blood pressure returned to baseline and stable Postop Assessment: no apparent nausea or vomiting Anesthetic complications: no   No notable events documented.  Last Vitals:  Vitals:   12/08/22 0906 12/08/22 1134  BP: 116/65 92/66  Pulse: (!) 58 75  Resp: 17 15  Temp: (!) 36.4 C (!) 36.3 C  SpO2: 96% 100%    Last Pain:  Vitals:   12/08/22 1134  TempSrc:   PainSc: Asleep                 [place]

## 2022-12-08 NOTE — Transfer of Care (Signed)
Immediate Anesthesia Transfer of Care Note  Patient: Regina Castaneda  Procedure(s) Performed: EXCISION SOFT TISSUE MASS LEFT POSTERIOR NECK (Left: Neck)  Patient Location: PACU  Anesthesia Type:General  Level of Consciousness: drowsy  Airway & Oxygen Therapy: Patient Spontanous Breathing and Patient connected to nasal cannula oxygen  Post-op Assessment: Report given to RN  Post vital signs: Reviewed and stable  Last Vitals:  Vitals Value Taken Time  BP 92/66 12/08/22 1134  Temp    Pulse 73 12/08/22 1135  Resp 17 12/08/22 1135  SpO2 100 % 12/08/22 1135  Vitals shown include unfiled device data.  Last Pain:  Vitals:   12/08/22 0906  TempSrc: Oral  PainSc: 0-No pain      Patients Stated Pain Goal: 5 (12/08/22 0906)  Complications: No notable events documented.

## 2022-12-08 NOTE — Anesthesia Preprocedure Evaluation (Signed)
Anesthesia Evaluation  Patient identified by MRN, date of birth, ID band Patient awake    Reviewed: Allergy & Precautions, H&P , NPO status , Patient's Chart, lab work & pertinent test results  Airway Mallampati: III  TM Distance: >3 FB Neck ROM: Full    Dental no notable dental hx.    Pulmonary neg pulmonary ROS   Pulmonary exam normal breath sounds clear to auscultation       Cardiovascular negative cardio ROS Normal cardiovascular exam Rhythm:Regular Rate:Normal     Neuro/Psych negative neurological ROS  negative psych ROS   GI/Hepatic negative GI ROS, Neg liver ROS,,,  Endo/Other  diabetes    Renal/GU negative Renal ROS  negative genitourinary   Musculoskeletal Lipoma on left neck   Abdominal   Peds negative pediatric ROS (+)  Hematology negative hematology ROS (+)   Anesthesia Other Findings   Reproductive/Obstetrics negative OB ROS                             Anesthesia Physical Anesthesia Plan  ASA: 2  Anesthesia Plan: General   Post-op Pain Management:    Induction: Intravenous  PONV Risk Score and Plan: Ondansetron and Dexamethasone  Airway Management Planned: LMA  Additional Equipment:   Intra-op Plan:   Post-operative Plan: Extubation in OR  Informed Consent: I have reviewed the patients History and Physical, chart, labs and discussed the procedure including the risks, benefits and alternatives for the proposed anesthesia with the patient or authorized representative who has indicated his/her understanding and acceptance.     Dental advisory given  Plan Discussed with: CRNA  Anesthesia Plan Comments:        Anesthesia Quick Evaluation

## 2022-12-08 NOTE — Interval H&P Note (Signed)
History and Physical Interval Note:  12/08/2022 10:11 AM  Tanja Port  has presented today for surgery, with the diagnosis of SOFT TISSUE MASS OF NECK.  The various methods of treatment have been discussed with the patient and family. After consideration of risks, benefits and other options for treatment, the patient has consented to    Procedure(s) with comments: EXCISION SOFT TISSUE MASS LEFT POSTERIOR NECK (Left) - GENERAL AND LMA as a surgical intervention.    The patient's history has been reviewed, patient examined, no change in status, stable for surgery.  I have reviewed the patient's chart and labs.  Questions were answered to the patient's satisfaction.    Darnell Level, MD [facility] practice Office: [phone]   Darnell Level

## 2022-12-08 NOTE — Op Note (Signed)
Operative Note  Pre-operative Diagnosis:  soft tissue mass, left posterior neck  Post-operative Diagnosis:  sebaceous cyst, left posterior neck (5 x 5 x 4 cm), subcutaneous  Surgeon:  Darnell Level, MD  Assistant:  none   Procedure:  Excision of sebaceous cyst left posterior neck  Anesthesia:  general (LMA)  Estimated Blood Loss:  5 cc  Drains: none         Specimen: to pathology  Indications:  Patient is referred by Dr. [place] Bing for surgical evaluation and management of an enlarging soft tissue mass on the left posterior neck. Patient states this has been present for several years. It is gradually increased in size. It is now causing discomfort. Patient is referred for surgical resection. Patient has no prior history of any other such lesions being removed. She has no other lesions of this type on her torso or extremities. She denies any history of infection or drainage. She is accompanied by a family member who assist with interpretation.   Procedure:  The patient was seen in the pre-op holding area. The risks, benefits, complications, treatment options, and expected outcomes were previously discussed with the patient. The patient agreed with the proposed plan and has signed the informed consent form.  The patient was brought to the operating room by the surgical team, identified as Tanja Port and the procedure verified. A "time out" was completed and the above information confirmed.  Following administration of general anesthesia, the patient was positioned in a right lateral position on the bean bag.  A skin incision was made over the mass with a #15 blade.  Dissection was carried into the subcutaneous tissues and a large sebaceous cyst was encountered.  Incision was made into an elliptical incision to encompass the sinus tract to the skin.  The wall of the cyst was dissected out using the electrocautery for hemostasis.  The entire cyst was excised and submitted to  pathology.  Subcutaneous tissues were irrigated.  Wound was closed with interrupted 3-0 Vicryl sutures and the skin closed with a running 4-0 Monocryl suture.  Wound was washed and dried and Dermabond was applied.  Patient was awakened from anesthesia and transported to the recovery room.  Darnell Level, MD [facility] practice Office: [phone]

## 2022-12-09 ENCOUNTER — Encounter (HOSPITAL_BASED_OUTPATIENT_CLINIC_OR_DEPARTMENT_OTHER): Payer: Self-pay | Admitting: Surgery

## 2022-12-09 LAB — SURGICAL PATHOLOGY

## 2022-12-09 NOTE — Progress Notes (Signed)
Benign sebaceous cyst.  tmg  Darnell Level, MD [facility] practice Office: [phone]

## 2023-01-13 ENCOUNTER — Telehealth: Payer: Self-pay

## 2023-01-13 NOTE — Telephone Encounter (Addendum)
 Telephoned patient at mobile number using interpreter, Regina Castaneda. No answer, voice not an option at this time.  Telephoned patient using interpreter#[phone]. Voice mail not an option at this time.

## 2023-08-05 IMAGING — MG MM DIGITAL SCREENING BILAT W/ TOMO AND CAD
8 series · 9 of 24 positions shown · non-contrast
Comparison: Previous exam(s).

CLINICAL DATA: Screening.

EXAM:
DIGITAL SCREENING BILATERAL MAMMOGRAM WITH TOMOSYNTHESIS AND CAD
TECHNIQUE: Bilateral screening digital craniocaudal and mediolateral oblique
mammograms were obtained. Bilateral screening digital breast
tomosynthesis was performed. The images were evaluated with
computer-aided detection.

[L MLO synth-2D]
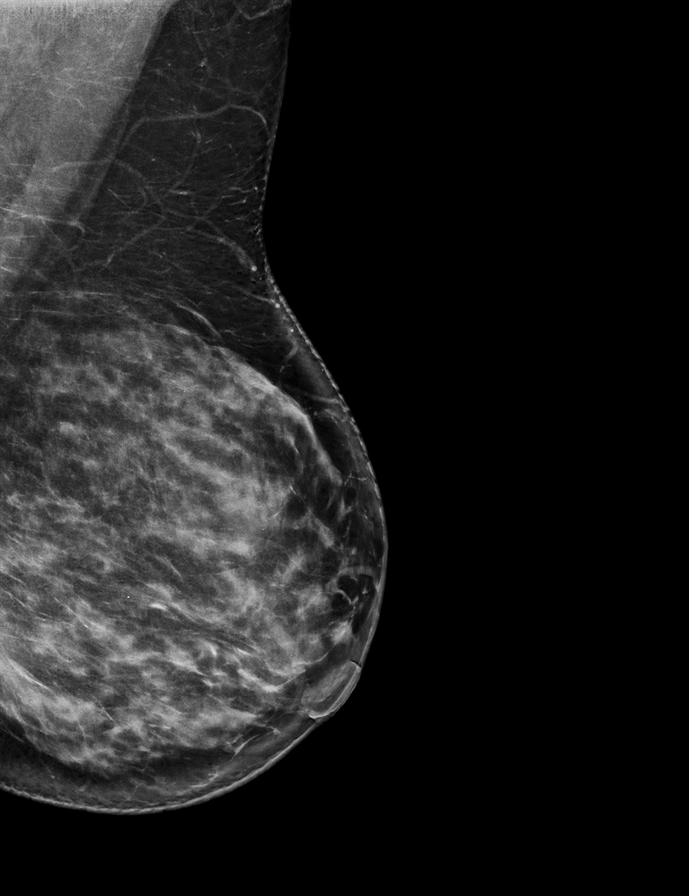

[R CC synth-2D]
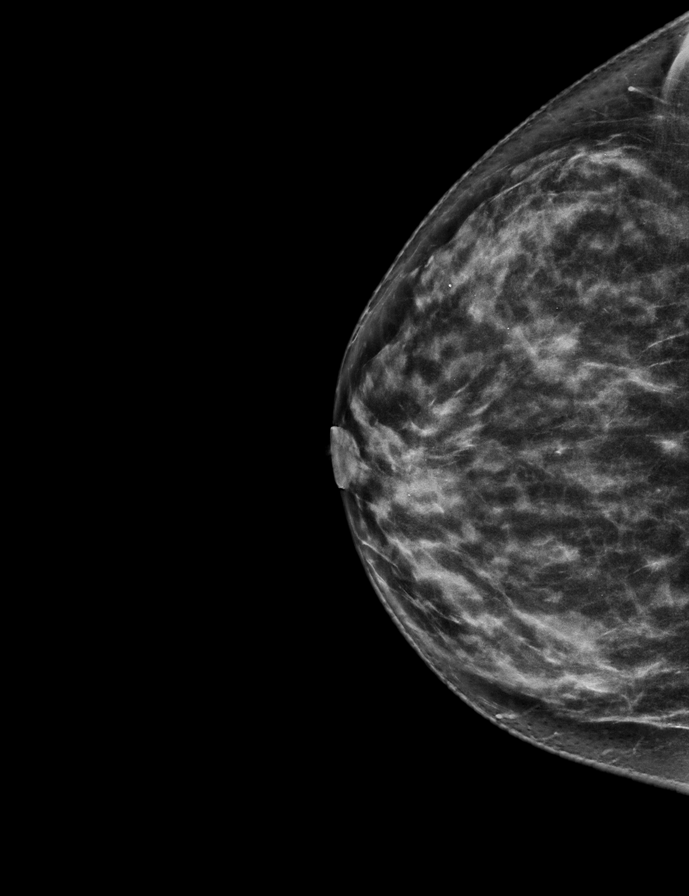

[R MLO synth-2D]
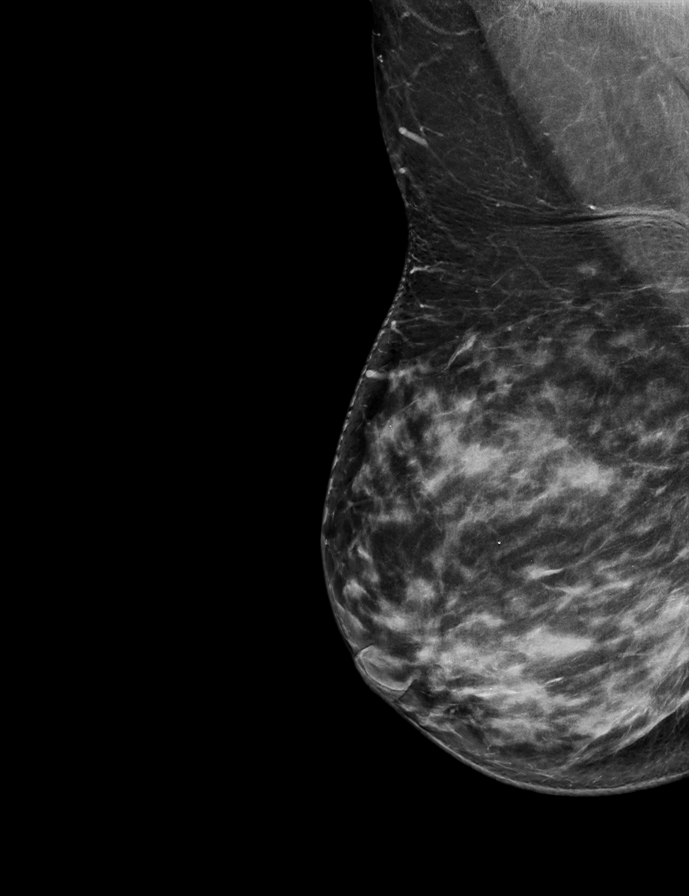

[L CC synth-2D]
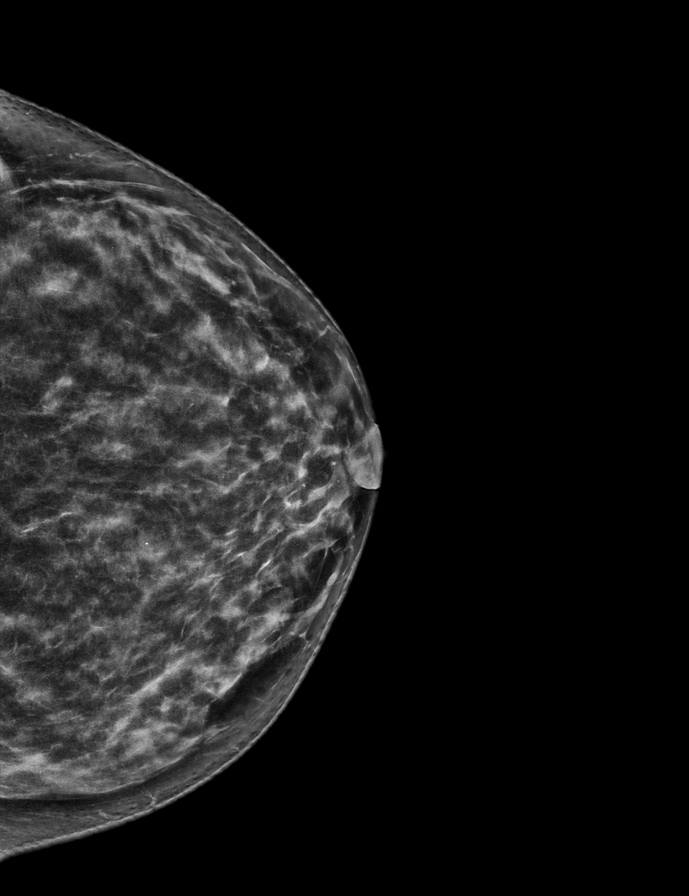

[R MLO tomo · 2 of 77 frames shown]
[frame 25/77]
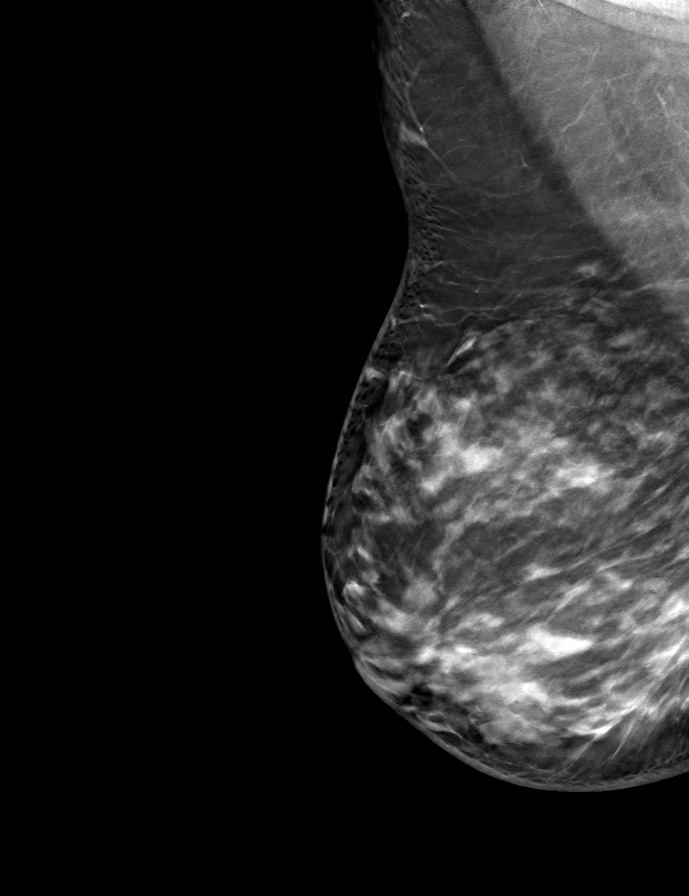
[frame 39/77]
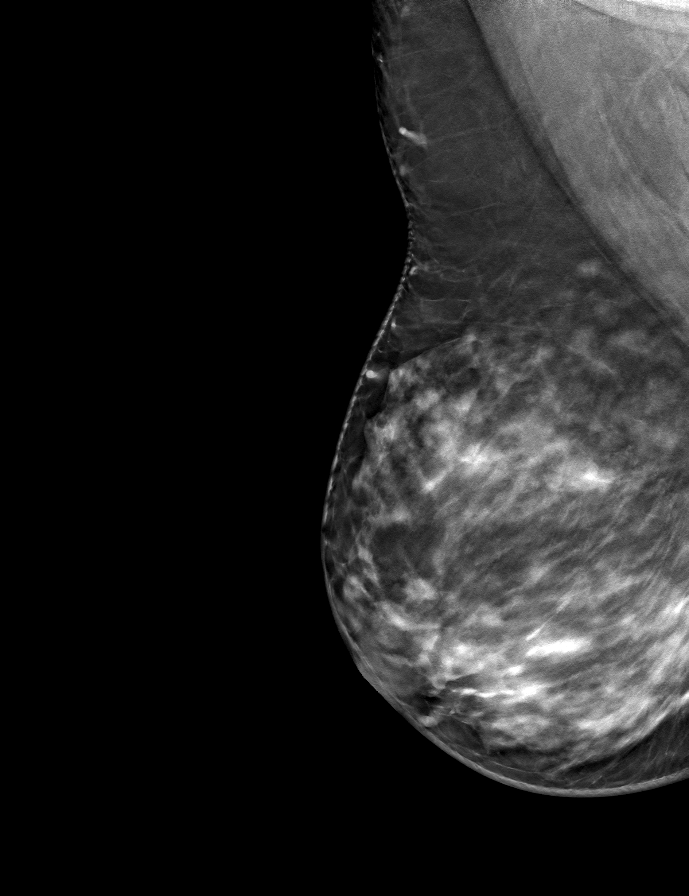

[R CC tomo · tomo slice 33/66.0]
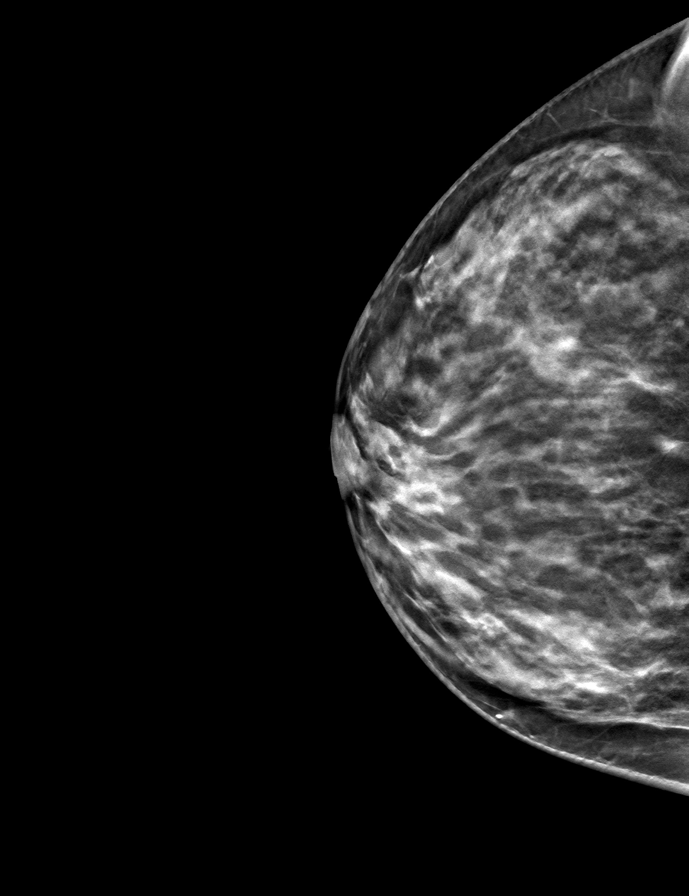

[L CC tomo · tomo slice 33/64.0]
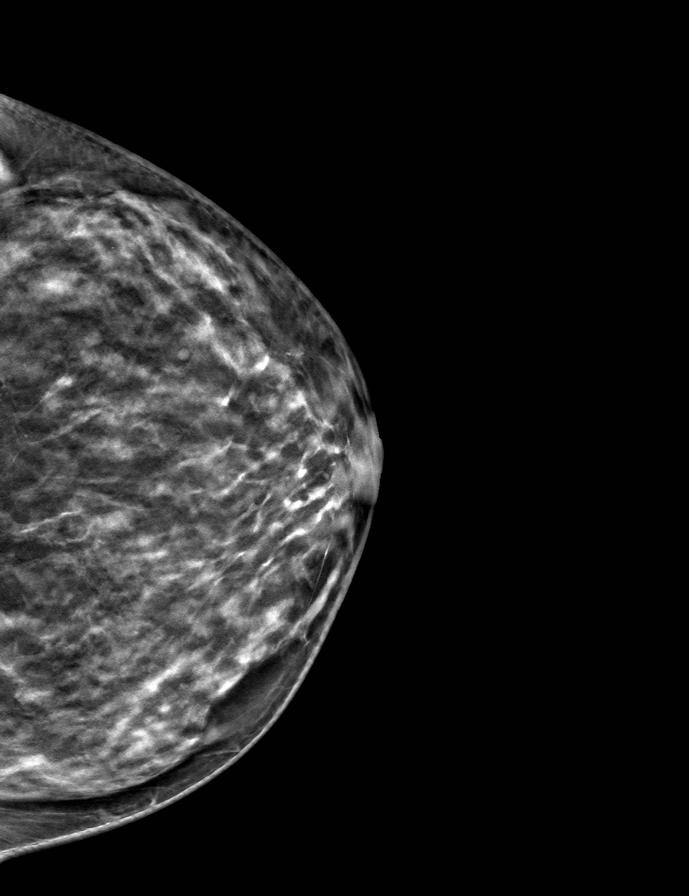

[L MLO tomo · tomo slice 39/76.0]
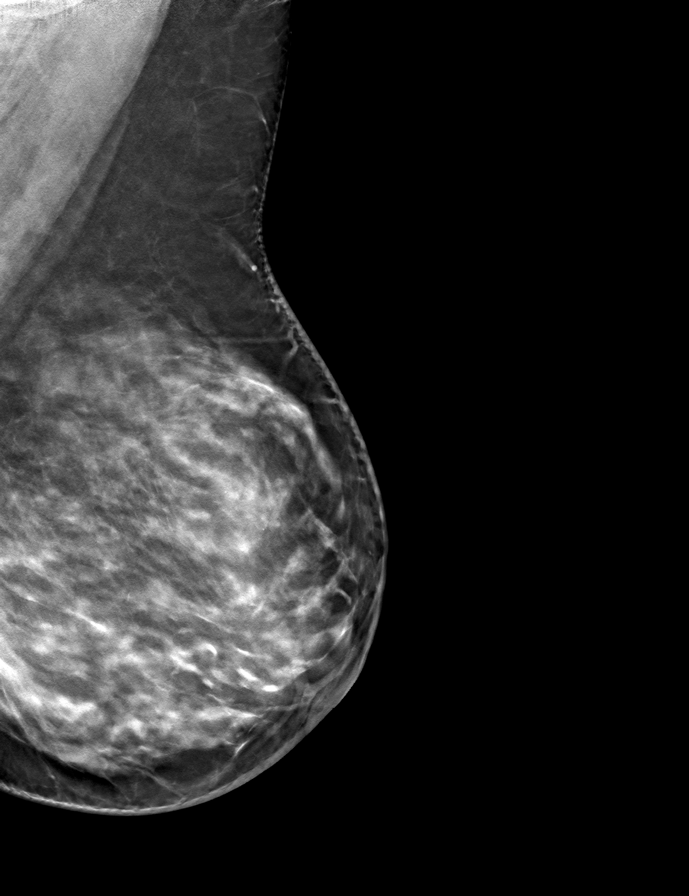

[9 of 24 positions shown; findings below may reference images not displayed]

ACR Breast Density Category d: The breast tissue is extremely dense,
which lowers the sensitivity of mammography
FINDINGS: There are no findings suspicious for malignancy.
IMPRESSION: No mammographic evidence of malignancy. A result letter of this
screening mammogram will be mailed directly to the patient.

RECOMMENDATION:
Screening mammogram in one year. (Code:TA-V-WV9)

BI-RADS CATEGORY  1: Negative.
